# Patient Record
Sex: Male | Born: 1935 | Race: White | Hispanic: No | Marital: Married
Health system: Southern US, Community
[De-identification: ages and names within clinical notes are randomized; demographics above are authoritative.]

---

## 2000-07-23 ENCOUNTER — Inpatient Hospital Stay (HOSPITAL_COMMUNITY)
Admission: EM | Admit: 2000-07-23 | Discharge: 2000-07-31 | Payer: Self-pay | Admitting: Thoracic Surgery (Cardiothoracic Vascular Surgery)

## 2000-07-23 ENCOUNTER — Encounter (INDEPENDENT_AMBULATORY_CARE_PROVIDER_SITE_OTHER): Payer: Self-pay | Admitting: Specialist

## 2000-07-23 ENCOUNTER — Encounter (INDEPENDENT_AMBULATORY_CARE_PROVIDER_SITE_OTHER): Payer: Self-pay | Admitting: *Deleted

## 2000-07-23 ENCOUNTER — Encounter: Payer: Self-pay | Admitting: Thoracic Surgery (Cardiothoracic Vascular Surgery)

## 2000-07-24 ENCOUNTER — Encounter: Payer: Self-pay | Admitting: Thoracic Surgery (Cardiothoracic Vascular Surgery)

## 2000-07-26 ENCOUNTER — Encounter: Payer: Self-pay | Admitting: Critical Care Medicine

## 2005-01-02 ENCOUNTER — Ambulatory Visit: Payer: Self-pay | Admitting: Unknown Physician Specialty

## 2006-03-25 ENCOUNTER — Inpatient Hospital Stay (HOSPITAL_COMMUNITY): Admission: EM | Admit: 2006-03-25 | Discharge: 2006-04-02 | Payer: Self-pay | Admitting: *Deleted

## 2006-03-28 ENCOUNTER — Ambulatory Visit: Payer: Self-pay | Admitting: Physical Medicine & Rehabilitation

## 2006-04-02 ENCOUNTER — Encounter: Payer: Self-pay | Admitting: Internal Medicine

## 2006-04-18 ENCOUNTER — Other Ambulatory Visit: Payer: Self-pay

## 2006-04-18 ENCOUNTER — Emergency Department: Payer: Self-pay | Admitting: Emergency Medicine

## 2006-04-30 ENCOUNTER — Ambulatory Visit: Payer: Self-pay | Admitting: Neurosurgery

## 2006-05-01 ENCOUNTER — Ambulatory Visit: Payer: Self-pay | Admitting: Cardiology

## 2006-05-01 ENCOUNTER — Inpatient Hospital Stay (HOSPITAL_COMMUNITY): Admission: AD | Admit: 2006-05-01 | Discharge: 2006-05-07 | Payer: Self-pay | Admitting: Neurosurgery

## 2006-05-02 ENCOUNTER — Encounter: Payer: Self-pay | Admitting: Internal Medicine

## 2006-05-04 ENCOUNTER — Ambulatory Visit: Payer: Self-pay | Admitting: Thoracic Surgery (Cardiothoracic Vascular Surgery)

## 2006-05-07 ENCOUNTER — Ambulatory Visit: Payer: Self-pay | Admitting: Physical Medicine & Rehabilitation

## 2006-05-07 ENCOUNTER — Inpatient Hospital Stay (HOSPITAL_COMMUNITY)
Admission: RE | Admit: 2006-05-07 | Discharge: 2006-05-28 | Payer: Self-pay | Admitting: Physical Medicine & Rehabilitation

## 2006-08-06 ENCOUNTER — Encounter: Payer: Self-pay | Admitting: Neurosurgery

## 2006-09-02 ENCOUNTER — Encounter: Payer: Self-pay | Admitting: Neurosurgery

## 2006-10-02 ENCOUNTER — Encounter: Payer: Self-pay | Admitting: Neurosurgery

## 2007-08-16 IMAGING — CR DG THORACIC SPINE 2V
3 series · 3 of 3 positions shown · non-contrast
Comparison: none

CLINICAL DATA: Thoracic cord compression

THORACIC SPINE - 2 VIEW

[t t-spine a.p.]
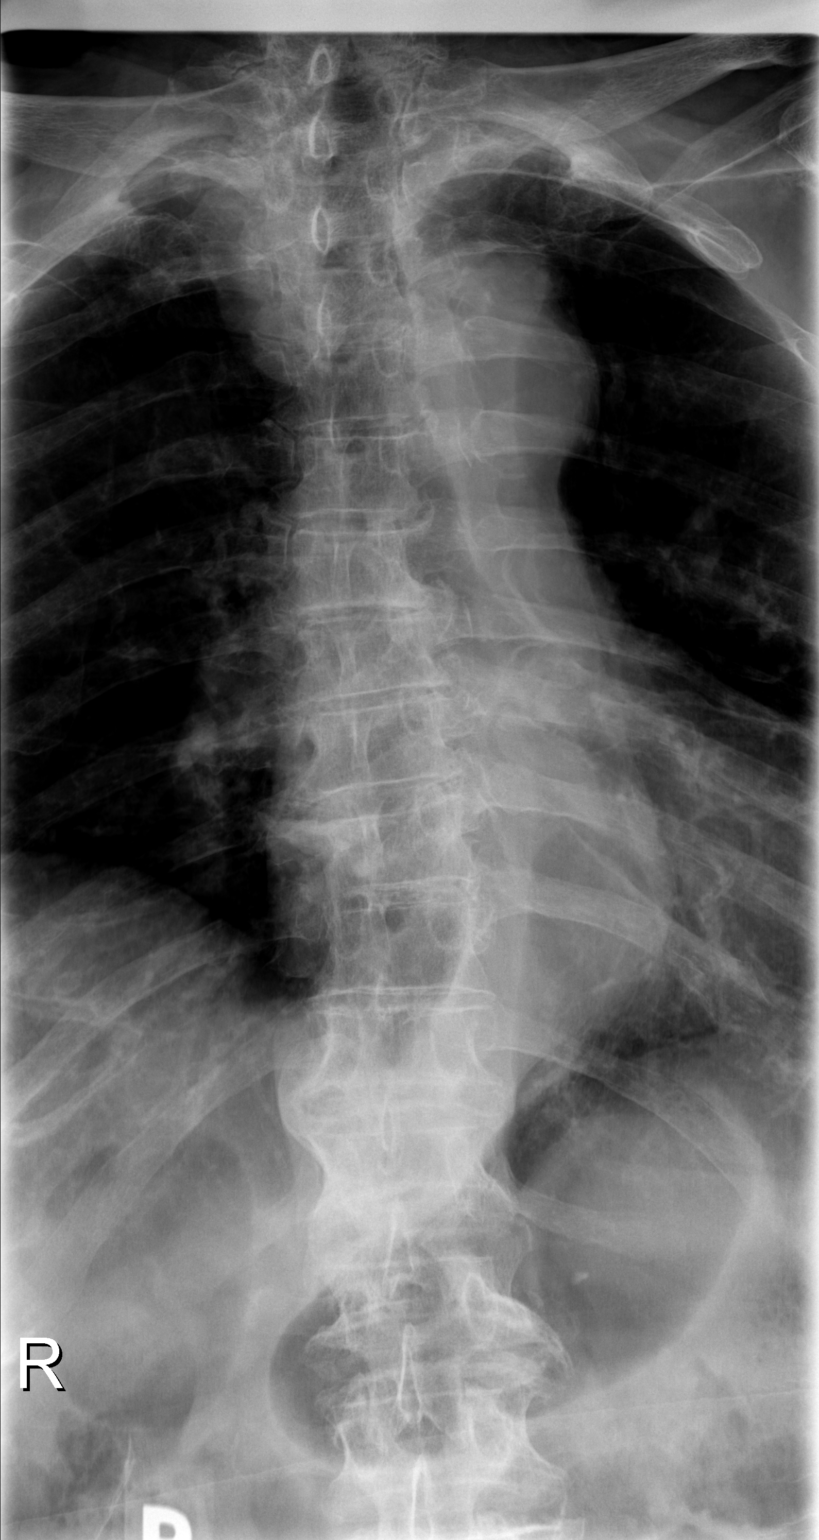

[t t-spine lat]
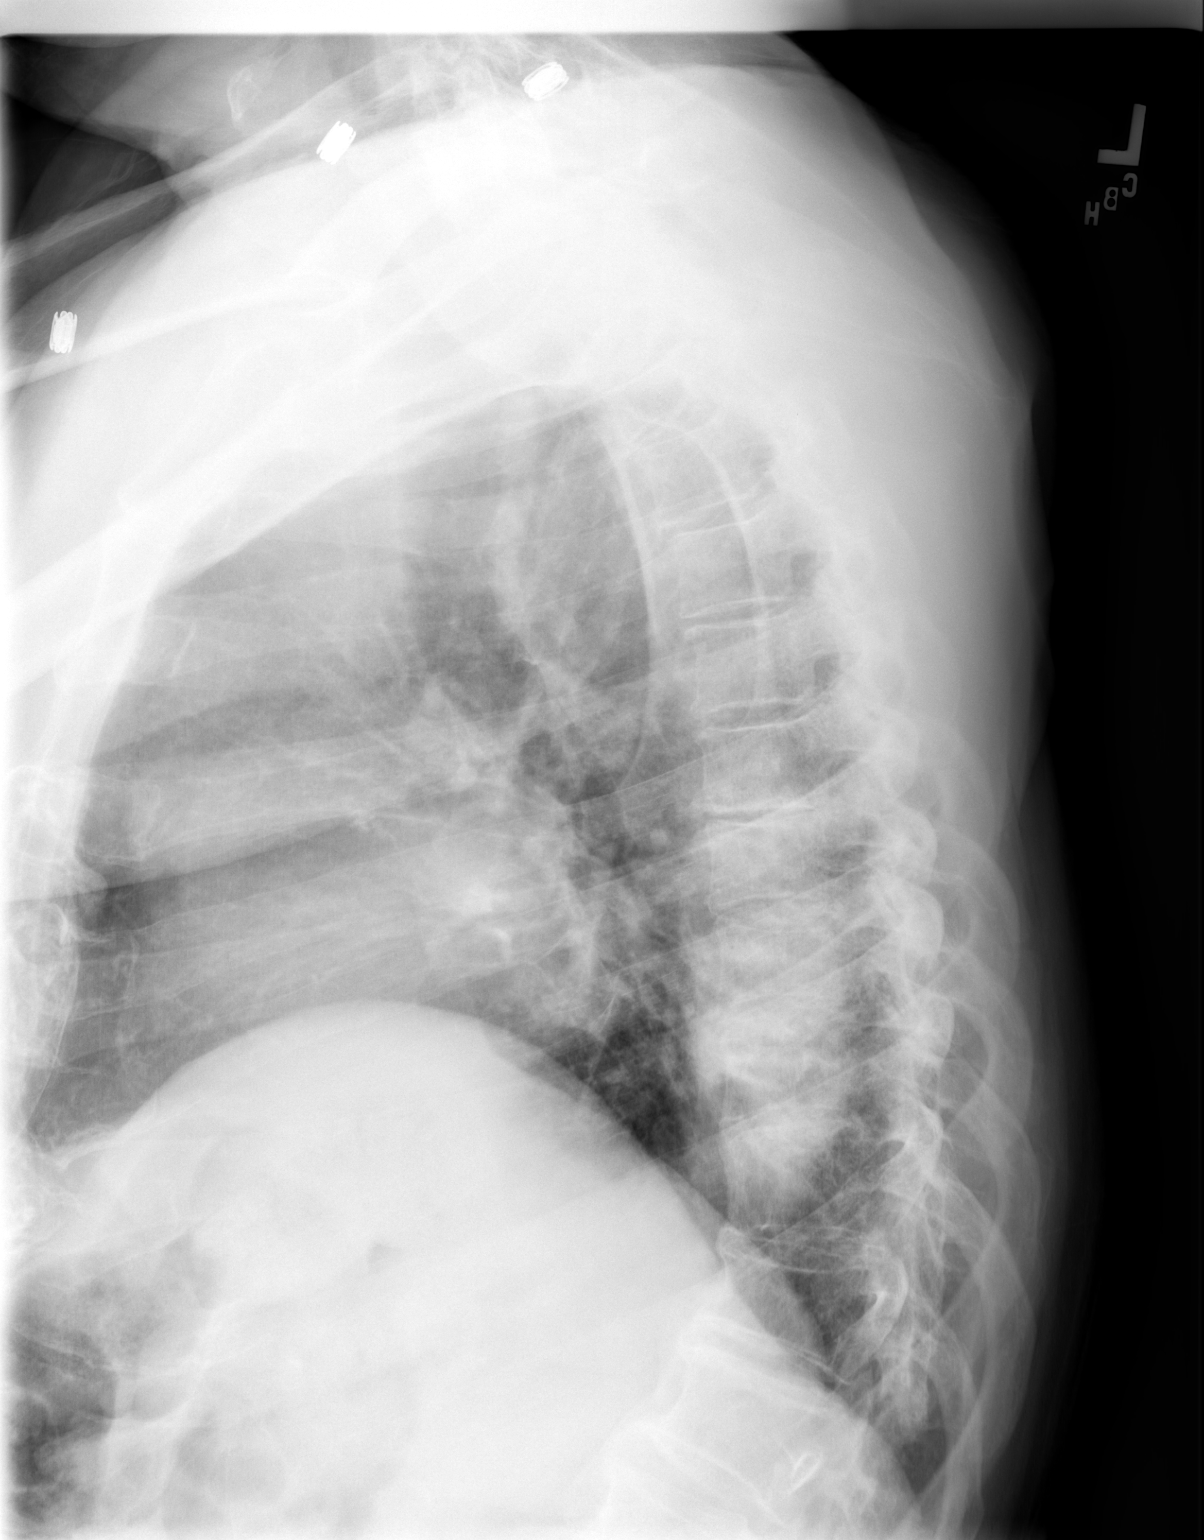

[t swimmers]
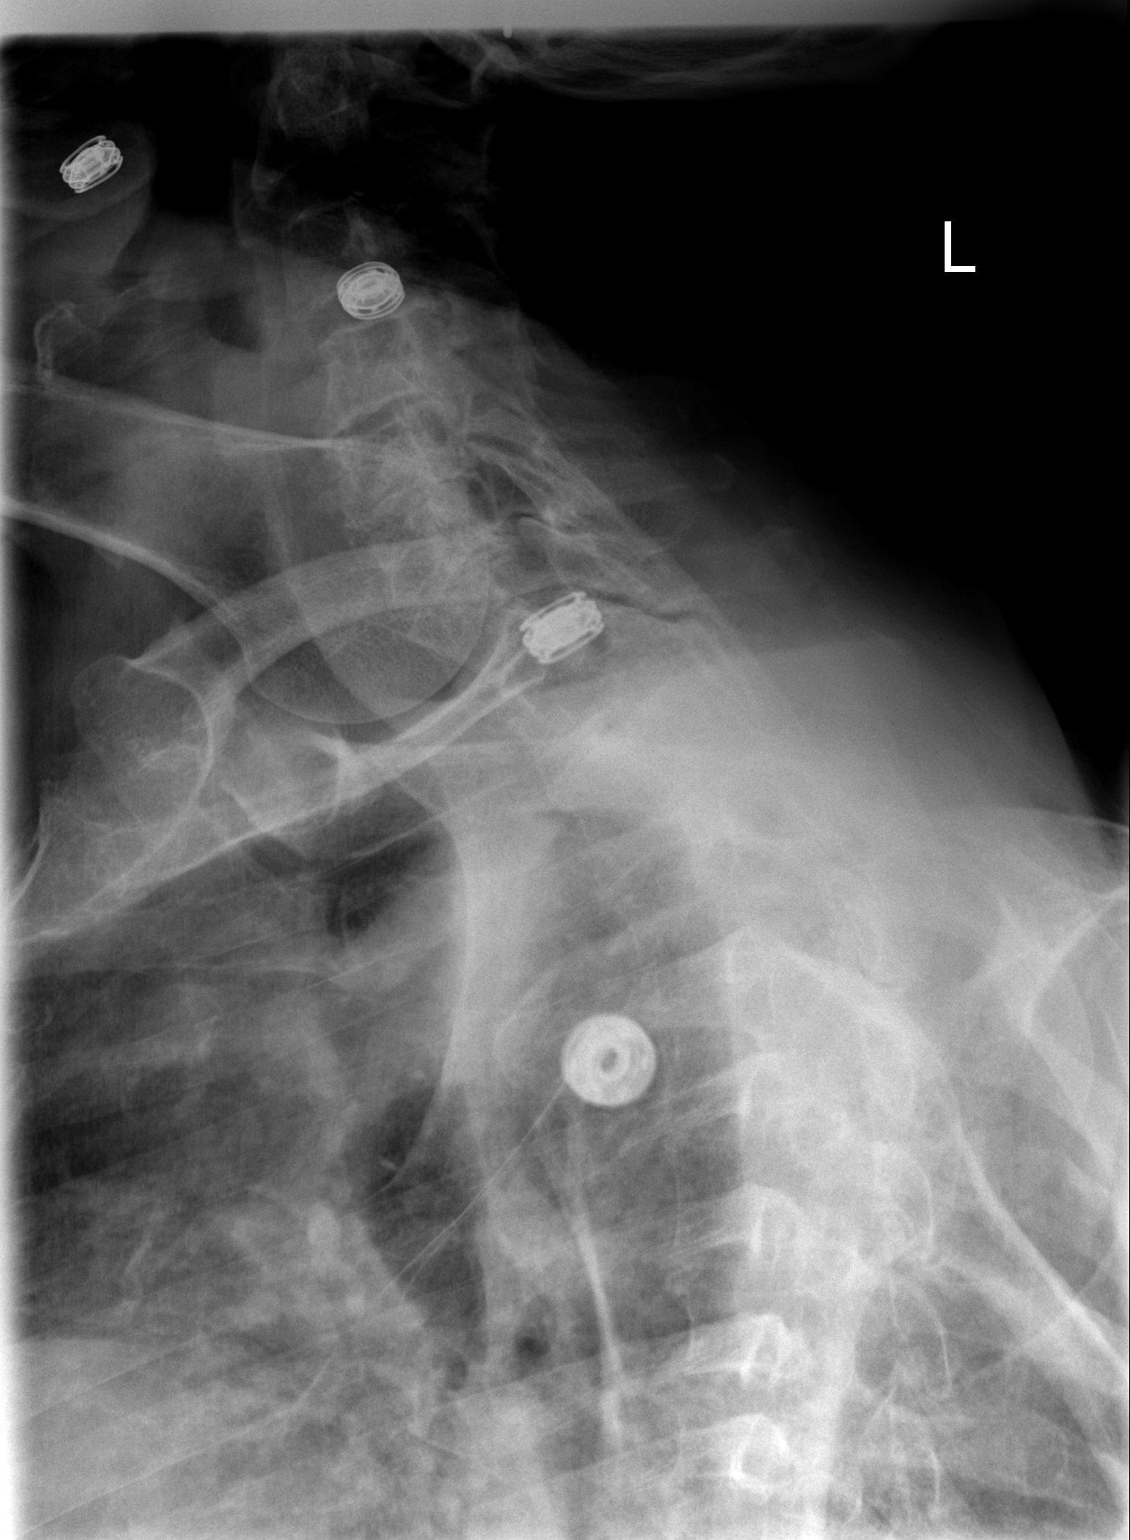

[3 of 3 positions shown; findings below may reference images not displayed]

FINDINGS: There is slight compression of a lower thoracic vertebral body with
associated kyphosis. No malalignment otherwise. Mild degenerative changes
throughout the thoracic spine.

IMPRESSION

Slight compression of the lower thoracic vertebral body with associated
kyphosis.

Mild degenerative changes.

## 2007-08-16 IMAGING — CR DG CHEST 2V
1 series · 1 of 1 positions shown · non-contrast
Comparison: 03/25/2006

CLINICAL DATA: Thoracic cord compression

CHEST - 2 VIEW:

[view not recorded]
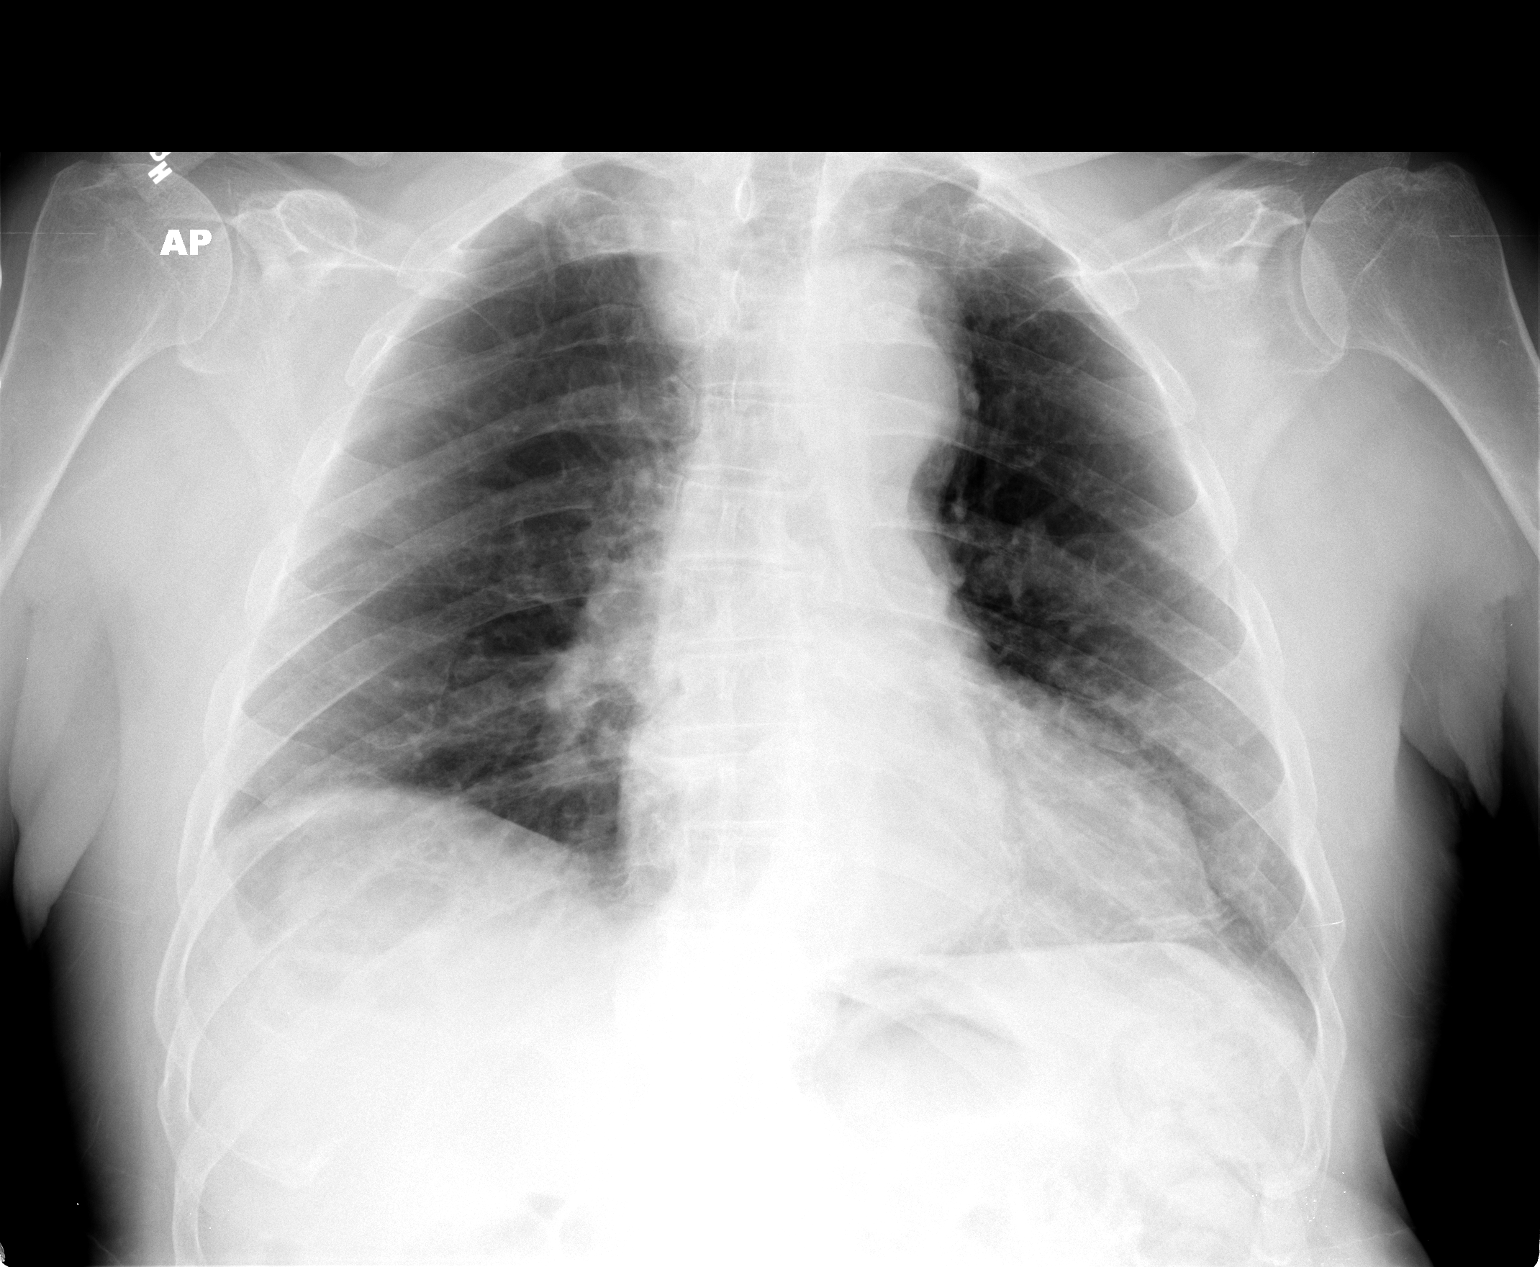

[1 of 1 positions shown; findings below may reference images not displayed]

FINDINGS: There is mild cardiomegaly. Aneurysmal dilatation of the aortic arch
again noted, stable. Tortuosity of the remainder of the thoracic aorta present.
There is bibasilar atelectasis or scarring. No effusions.

Slight compression fracture seen within the lower thoracic spine with associated
kyphosis.
IMPRESSION: Mild cardiomegaly.

Stable aneurysmal dilatation of the aortic arch.

Bibasilar atelectasis or scarring.

## 2008-05-12 ENCOUNTER — Encounter: Payer: Self-pay | Admitting: Internal Medicine

## 2008-06-01 ENCOUNTER — Encounter: Payer: Self-pay | Admitting: Internal Medicine

## 2008-11-17 ENCOUNTER — Ambulatory Visit: Payer: Self-pay | Admitting: Internal Medicine

## 2008-11-23 ENCOUNTER — Ambulatory Visit: Payer: Self-pay | Admitting: Internal Medicine

## 2008-12-01 ENCOUNTER — Ambulatory Visit: Payer: Self-pay | Admitting: Internal Medicine

## 2008-12-08 ENCOUNTER — Ambulatory Visit: Payer: Self-pay | Admitting: Internal Medicine

## 2008-12-15 ENCOUNTER — Ambulatory Visit: Payer: Self-pay | Admitting: Internal Medicine

## 2008-12-22 ENCOUNTER — Ambulatory Visit: Payer: Self-pay | Admitting: Internal Medicine

## 2008-12-30 ENCOUNTER — Ambulatory Visit: Payer: Self-pay | Admitting: Internal Medicine

## 2009-01-06 ENCOUNTER — Ambulatory Visit: Payer: Self-pay | Admitting: Internal Medicine

## 2009-01-13 ENCOUNTER — Ambulatory Visit: Payer: Self-pay | Admitting: Internal Medicine

## 2009-01-28 ENCOUNTER — Ambulatory Visit: Payer: Self-pay | Admitting: Internal Medicine

## 2010-01-22 ENCOUNTER — Encounter: Payer: Self-pay | Admitting: Thoracic Surgery (Cardiothoracic Vascular Surgery)

## 2010-05-19 NOTE — Consult Note (Signed)
NAMELEVERT, Timothy [name]               ACCOUNT NO.:  [id number]   MEDICAL RECORD NO.:  [id number]          PATIENT TYPE:  IPS   LOCATION:  4008                         FACILITY:  [facility]   PHYSICIAN:  Lucrezia Starch. Earlene Plater, M.D.  DATE OF BIRTH:  1920/03/17   DATE OF CONSULTATION:  05/21/2006  DATE OF DISCHARGE:                                 CONSULTATION   REASON FOR CONSULT:  Urinary retention.   HISTORY:  Patient is currently a resident in the rehab center at [facility].  He was admitted here on May 07, 2006 for weakness and  continued leg buckling status post recent surgery.  He underwent T7-T8  transpedicular diskectomy and microdiskectomy of T7-T8 HNP on May 2.  Since being in rehab, has required consistent and intermittent  catheterization for retention.  Patient does have occasional voids with  BM; however, unable to generate voluntary stream.  He does have a  history of PH, on Flomax at home; however, denies significant urinary  symptoms, no previous prostate surgery, and no previous episodes of  urinary retention.  He is currently on Flomax 0.8 mg daily and  Urecholine 50 mg p.o. q.i.d.   PAST MEDICAL HISTORY:  Significant for:  1. A history of pelvic fracture.  2. Left wrist fracture with ORIF.  3. Thoracic aortic aneurysm.  4. PE.  5. Anxiety.  6. Hypertension.  7. Colon polyps.  8. Pernicious anemia.  9. Thoracic spondylolisthesis.  10.BPH.  11.Left knee with osteoarthritis.  12.Type III aortic dissection, managed medically.  13.T&A.  14.Appendectomy.   FAMILY HISTORY:  Positive for CAD and cancer.   SOCIAL HISTORY:  Married.   PHYSICAL EXAM:  GENERAL:  77 year old white male in no acute distress,  alert and oriented x3.  VITAL SIGNS:  Stable.  HEENT:  Normocephalic.  ABDOMEN:  Soft and nontender.  GU:  Prostate 40 g without masses.   Urine culture negative.  BUN 16, creatinine 0.89.   IMPRESSION:  1. Urinary retention.  2. Benign prostatic  hypertrophy.   PLAN:  18-French Cude was placed without difficulty.  We will continue  Flomax.  We will discontinue Urecholine at this point.  Have patient  follow up with urology clinic once discharged, and those instructions  were placed on the chart and teach leg bag use prior to discharge.     ______________________________  Alessandra Bevels. Chase Picket, FNP-C      Ronald L. Earlene Plater, M.D.  Electronically Signed    JML/MEDQ  D:  05/21/2006  T:  05/22/2006  Job:  [id number]

## 2010-05-19 NOTE — Discharge Summary (Signed)
Timothy [name], Timothy [name]               ACCOUNT NO.:  [id number]   MEDICAL RECORD NO.:  [id number]          PATIENT TYPE:  INP   LOCATION:  3041                         FACILITY:  [facility]   PHYSICIAN:  Danae Orleans. Venetia Maxon, M.D.  DATE OF BIRTH:  1920-09-17   DATE OF ADMISSION:  03/25/2006  DATE OF DISCHARGE:                               DISCHARGE SUMMARY   HOSPITAL COURSE:  Patient is an 77 year old man who was admitted through  [facility] emergency room when he was unable to stand or walk.  He has been having progressively more incapacitating problems with  lumbar spinal stenosis over the last 8 years.  He was admitted to the  hospital on 03/25/2006.  He had full strength on confrontational testing  but inability to stand for more than a few minutes at a time.  He was  admitted to the hospital and then taken to surgery on 03/26/2006 when he  underwent uncomplicated decompression surgery at L3-L4 and L4-L5 with  bilateral laminar foraminotomies for stenosis.   Postoperatively he had problems with urinary retention requiring  placement of indwelling catheter and was gradually mobilized with  physical therapy.  He had full strength on confrontational testing in  both lower extremities and plantar and dorsal flexion and has had  progressive improvement in his lower extremity numbness.  He felt that  he is getting better.   In time, he was initially felt to be a good candidate for inpatient  rehabilitation at [facility], but he subsequently wished to be  transferred closer to [facility], and the plan is for him to do so.   He will follow up with me in my office in 3-4 weeks for postoperative  visit.   DISCHARGE MEDICATIONS:  1. Lisinopril 20 mg daily.  2. Hydrochlorothiazide 12.5 mg daily.  3. Amlodipine 10 mg daily.  4. Metoprolol XL 100 mg daily sustained release.  5. He takes diazepam 5 mg every 6 hours as needed for musculature      spasm although has not been taking,  not been requiring that; and      hydrocodone 1-2 every 4-6 hours as needed for pain.      Danae Orleans. Venetia Maxon, M.D.  Electronically Signed     JDS/MEDQ  D:  04/01/2006  T:  04/01/2006  Job:  [id number]

## 2010-05-19 NOTE — H&P (Signed)
[place]. [facility]  Patient:    Timothy [name], Timothy [name]                        MRN: [id number] Adm. Date:  07/23/00 Attending:  Salvatore Decent. [name], M.D. Dictator:   Adair Patter, P.A.                         History and Physical  CHIEF COMPLAINT AND HISTORY OF PRESENT ILLNESS:  This is a 77 year old man who complained of chest pain and shortness of breath that started approximately five days ago.  He was seen by his medical doctor who treated him for indigestion and discharged him home.  Patient states that the pain was not relieved and was described as pressure that started on his right shoulder blade.  He said it was worse with breathing and pleuritic in nature.  The pain did not subside so he went to [facility] where he was found to have a pulmonary embolism and thoracic aortic aneurysm by CAT scan. Patient also underwent a stress test on this date which was found to be normal.  Because of his pulmonary embolism and thoracic aortic aneurysm, Timothy [name] was transferred to [facility] under the care of Dr. Salvatore Decent. [name] for treatment of this thoracic aneurysm and pulmonary embolism.  PAST MEDICAL HISTORY:  Hypertension.  PAST SURGICAL HISTORY: 1. Open reduction and internal fixation of his left wrist. 2. Appendectomy.  ALLERGIES:  The patient denies any known medication allergies.  MEDICATIONS: 1. Aspirin 325 mg one tablet daily. 2. Altace 2.5 mg one tablet daily. 3. Prilosec 20 mg one tablet twice day.  FAMILY HISTORY:  Noncontributory.  SOCIAL HISTORY:  Patient states he has a 10- to 15-year history of smoking, however, he quit 40 years ago.  He denies any alcohol or illicit drug use.  He lives at home with his wife.  PHYSICAL EXAMINATION:  VITAL SIGNS:  Patient is found to be afebrile with stable vital signs and 96% pulse oximetry on room air.  GENERAL:  Patient is alert and oriented x 3 and appears to be in no state  of apparent distress.  HEENT:  Head is atraumatic, normocephalic.  Eyes:  Pupils equal, round and reactive to light and accommodation.  Extraocular muscles are intact without scleral icterus or nystagmus.  Ears:  Auditory acuity is grossly intact. Nose:  Sinuses are intact.  Nasal patency is intact.  Mouth is moist without exudates.  NECK:  Supple.  Trachea is in midline without adenopathy, lymphadenopathy or thyromegaly.  No carotid bruits are noted.  CARDIOVASCULAR:  Regular rate and rhythm without murmurs, gallops or rubs.  LUNGS:  Clear to auscultation bilaterally without rales, rhonchi or wheezing.  ABDOMEN:  Soft, nontender, nondistended.  Positive bowel sounds in all four quadrants.  EXTREMITIES:  No gross orthopedic abnormalities.  Homans sign was negative. No calf tenderness is noted bilaterally.  No clubbing, cyanosis, edema or swelling of his lower extremities were noted bilaterally.  PERIPHERAL PULSES:  Exam revealed 2+ carotid, radial and brachial pulses bilaterally.  Patient had 2+ femoral pulses bilaterally.  He had 1+ popliteal pulses bilaterally.  He had 2+ dorsalis pedis pulses bilaterally and 1+ posterior tibial pulses bilaterally.  NEUROLOGIC:  Patient is alert and oriented x 3.  Cranial nerves II-XII are grossly intact.  No focal neurologic deficits were noted.  IMPRESSION:  [age]-year-old man with thoracic aneurysm and  pulmonary embolism by CAT scan taken to [facility].  PLAN:  Unfortunately, Timothy [name] was transferred to [facility] without copies of his CAT scan.  Ultimately, he will need anticoagulation for his pulmonary embolism, however, we need to ensure that his thoracic aneurysm is stable before starting any anticoagulation; because of this, patient will be sent for a STAT CAT scan of his chest with IV contrast.  Once this CAT scan is completed, we will tailor our treatment of his conditions accordingly.  DD: 07/23/00 TD:  07/24/00 Job: [id number]

## 2010-05-19 NOTE — Consult Note (Signed)
[place]. [facility]  Patient:    Timothy [name], Timothy [name]                        MRN: [id number] Proc. Date: 07/24/00 Attending:  Charlcie Cradle. Delford Field, M.D. [facility] CC:         Salvatore Decent. [name], M.D.  Guido Sander, M.D.; [place], [place]   Consultation Report  CHIEF COMPLAINT:  Pulmonary embolism, right upper lobe lung mass.  HISTORY OF PRESENT ILLNESS:  A 77 year old white male noted a weeks worth of fatigue, increased chest discomfort, pleuritic chest pain in the right upper chest area.  No real cough.  No hemoptysis.  Does not increased dyspnea, increased anorexia, but denies any fevers, chills, or sweats.  There is no previous prior cough to this.  Weight has been adequate.  Appetite good until last week.  Does note some left lower extremity edema.  Patient was admitted to [facility] on July 21, 2000 with atypical chest pain syndrome.  Had a negative myocardial ischemia scan.  CT chest showed right upper lobe cavitation and pulmonary embolism on spiral CT protocol.  Patient was started on Lovenox and transferred to [facility] for further evaluation because he was also noted to have a type III aortic dissection.  Question of whether or not anticoagulation was indicated.  Patient has a long-standing history of hypertension on prinzide.  This is the only other past history of note.  PAST MEDICAL HISTORY:  Hypertension as noted above.  No history of MI.  No history of TB exposure.  SOCIAL HISTORY:  Worked painting automobiles.  Did have some granular dust exposure.  Smoked 10 years two packs a day.  Quit 40 years ago.  REVIEW OF SYSTEMS:  No previous of known lung disease.  No chronic weight loss, fevers, chills, or sweats.  Noncontributory.  OPERATION:  Wrist fracture repair and appendectomy.  ALLERGIES:  None.  MEDICATIONS: 1. Prinzide 50/25 one q.d. 2. Aspirin 81 mg q.d.  FAMILY HISTORY:  Mother died of breast cancer.  PHYSICAL  EXAMINATION  VITAL SIGNS:  Temperature 98, blood pressure 130/60, pulse 65, saturation 95% on room air.  CHEST:  Equal breath sounds without evidence of wheeze or rhonchi.  CARDIAC:  Regular rate and rhythm with S3.  Normal S1, S2.  ABDOMEN:  Soft, nontender.  Bowel sounds active.  EXTREMITIES:  No edema, clubbing, or venous disease.  NEUROLOGIC:  Grossly intact.  HEENT:  No jugular venous distention, lymphadenopathy.  Oropharynx:  Clear.  NECK:  Supple.  LABORATORIES:  CT scan of the chest is reviewed and spiral PE protocol.  There are multiple pulmonary emboli in the lower lobes.  There is right hilar and mediastinal adenopathy and right upper lobe cavitary lesion without air fluid level.  There is a type III dissecting aortic aneurysm noted.  EKG showed sinus rhythm with PACs.  No ST-T wave changes.  From transfer:  White count 9.9, hemoglobin 14.8, platelet count 278,000. Sodium 132, potassium 3.8, chloride 95, CO2 24, BUN 26, creatinine 1.3, blood sugar 102.  Troponin I 0.1, CK-MB unremarkable.  PT/PTT were normal at baseline.  IMPRESSION: 1. Left upper lobe cavitary lesion with shaggy thickened border, pleural    involvement, and mediastinal and right hilar adenopathy compatible with    malignancy until proven otherwise of the lung. 2. Bilateral pulmonary embolism, rule out deep venous thrombosis.  Patient    likely hypercoagulable secondary to pulmonary process. 3. Thoracic aortic aneurysm type  III dissection, stable, and is non-operative    in approach.  RECOMMENDATIONS: 1. Non-operative treatment of aneurysm per CVTS.  Control of blood pressure. 2. Pursue bronchoscopy on July 26, 2000 to allow at least 72 hours for    heparinization to occur to stabilize clot.  Needs to be off heparin for at    least six to eight hours for the bronchoscopy.  At this point I do not    believe inferior vena cava filter is indicated as the patient is stable    with the current  pulmonary embolism. 3. IV heparin for pulmonary embolism and check venous Doppler studies of the    lower extremities.  Hold Coumadin for now until after bronchoscopy.  Will    transfer to our primary pulmonary service, involve CVTS as needed. DD:  07/24/00 TD:  07/24/00 Job: [id number]

## 2010-05-19 NOTE — Op Note (Signed)
Timothy [name], Timothy [name]               ACCOUNT NO.:  [id number]   MEDICAL RECORD NO.:  [id number]          PATIENT TYPE:  INP   LOCATION:  3110                         FACILITY:  [facility]   PHYSICIAN:  Danae Orleans. Venetia Maxon, M.D.  DATE OF BIRTH:  04/11/1920   DATE OF PROCEDURE:  05/03/2006  DATE OF DISCHARGE:                               OPERATIVE REPORT   PREOPERATIVE DIAGNOSIS:  Herniated thoracic disks, T7-8, with cord  compression and paraparesis.   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   PROCEDURE:  Left T7-8 transpedicular diskectomy with microdissection.   SURGEON:  Danae Orleans. Venetia Maxon, M.D.   ASSISTANT:  Benard Rink, R.N.   ANESTHESIA:  General endotracheal anesthesia.   ESTIMATED BLOOD LOSS:  Minimal.   COMPLICATIONS:  None.   DISPOSITION:  To recovery.   INDICATIONS:  Timothy [name] is an 77 year old man with bilateral with  paraparesis who was found to have a large thoracic disk herniation at T7-  8 causing significant cord compression.  It was elected to take him to  surgery for transpedicular thoracic diskectomy at this level.   PROCEDURE:  Timothy [name] was brought to the operating room.  Following  satisfactory uncomplicated induction of general endotracheal anesthesia  and placement of intravenous lines, the patient was placed in a prone  position on flap rolls.  His back was then prepped and draped in the  usual sterile fashion.  Plain incision was infiltrated with 0.25%  Marcaine and 0.50% lidocaine and 1:200,000 epinephrine.  Prior to  prepping his back, localizing x-ray was obtained which demonstrated  marker probe at the eighth rib.  Subsequently, incision was made and  carried down to what was felt to be corresponding rib and rib head, and  the new x-ray demonstrated this in fact to the seventh rib, and so  consequently, exposure was carried one level lower.  After confirming  rectal level, the high-speed year  was used to drill out the left T8  pedicle.  Initial exposure came down over the nerve root, and  subsequently drilling was carried cephalad into the facet joint complex,  and the pedicle was drilled out.  The lateral aspect of the spinal canal  was identified, remaining thin width layer of overlying bone was removed  including the superior articular process of the T8 and the inferior  facette portion of the inferior of facet of T7.  The disk space was  encountered and entered with micro pituitary, and multiple fragments of  disk material were removed.  Then working medially underneath the spinal  canal which this was all done under a microscopic visualization, the  large amount of herniated disk material was removed which was directly  compressing the thoracic spinal cord, and the spinal canal was  decompressed with removal of multiple large fragments of disk material.  It was felt that the dura became visible and was no longer deflected to  the right, and it was felt at this point that the spinal canal was well  decompressed.  Hemostasis was then referred, and Depo Medrol was placed  in the operative field.  The self-retaining retractor was removed.  The  thoracolumbar fascia was closed with 0 Vicryl sutures, subcutaneous  tissues were approximated with 2-0 Vicryl interrupted inverted sutures,  and skin edges were approximated with interrupted 3-0 Vicryl  subcuticular stitch.  The wound was dressed with Dermabond.  At the very  end of the case, the patient had elevation of ST segments on EKG and was  extubated in the operating room and taken to the recovery room.  A  cardiology consult was initiated because of this.  He was also to be  observed in the neuro ICU postoperatively.      Danae Orleans. Venetia Maxon, M.D.  Electronically Signed     JDS/MEDQ  D:  05/03/2006  T:  05/03/2006  Job:  [id number]

## 2010-05-19 NOTE — Discharge Summary (Signed)
NAMEENDRIT, GITTINS NO.:  [id number]   MEDICAL RECORD NO.:  [id number]          PATIENT TYPE:  IPS   LOCATION:  4008                         FACILITY:  [facility]   PHYSICIAN:  Ellwood Dense, M.D.   DATE OF BIRTH:  Oct 25, 1920   DATE OF ADMISSION:  05/07/2006  DATE OF DISCHARGE:  05/28/2006                               DISCHARGE SUMMARY   DISCHARGE DIAGNOSES:  1. T7-T8 diskectomy with microdissection.  2. Benign prostatic hypertrophy with urinary retention.  3. Hypertension.  4. Paraparesis.  5. Insomnia resolved.  6. Delirium resolved.   HISTORY OF PRESENT ILLNESS:  Mr. Kidney is an 77 year old male with  history of hypertension and pernicious anemia, lumbar laminectomies at  L3 to L5 on March 25. Discharged to [facility] on March 31.  The  patient was noted to have difficulty standing with inability to walk and  paraparesis.  Admitted April 30 to [facility] for workup.  MRI  of T spine showed mild compression fracture of T11 end plate with slight  cord compression with as well as aortic arch and descending thoracic  aneurysm.  The patient underwent left T7 to T8  transpedicular  diskectomy with microdissection of T7-T8, HNP on May 2 by Dr. Venetia Maxon.  Postop with EKG changes secondary to anterior T-wave inversion.  Dr.  Antoine Poche consulted for input and cardiac enzymes check.  Beta-blocker  was recommended.  The patient to have Myoview study on outpatient basis.  CVTS was consulted for type 3 dissection and they felt this was stable  without any changes.  Followup with Dr. [name] in six months with CT angio  of thoracic arch.   PAST MEDICAL HISTORY:  1. History of PE in 2002.  2. Thoracic arch aneurysm.  3. Left wrist fracture.  4. ORIF pelvic fracture.  5. T&A.  6. Appendectomy.  7. Anxiety and occasional tremors.  8. Type 3 aortic dissection.  9. Left knee with moderate to severe osteoarthritis.  10.BPH.  11.Thoracic spondylosis.  12.Pernicious anemia.  13.Colon polyps.  14.Hypertension.   ALLERGIES:  VALIUM and PERCOCET.   FAMILY HISTORY:  Positive for coronary artery disease and cancer.   SOCIAL HISTORY:  The patient is married, was independent in walking  prior to March 2008.  Does not use any tobacco or alcohol.  Lives in two-  level home with three steps at entry.   HOSPITAL COURSE:  Mr. Natnael Biederman was admitted to rehab on May 07, 2006  for inpatient therapies to consist of PT/OT daily.  Past admission the  patient was maintained on Flomax and as history of urinary retention  after last surgery, PVRs were checked with __________  scan.  Blood  pressure is monitored initially on HCTZ, Prinivil and Lopressor.  Blood  pressures have been checked on b.i.d. basis and noted to be running lows  in 90s to 50s initially.  HCTZ as well as Prinivil were discontinued.  Currently the patient remains on Lopressor 50 mg daily with blood  pressures ranging from 110s to 130s systolic, 50s to 16X diastolic.  Heart rate stable in 50s  to 60s range.  Labs were checked past admission  and revealed hemoglobin 12, hematocrit 34.1, white count of 11,  platelets 98,000.  Check of lytes showed mild hyponatremia with sodium  at 133.  Recheck lytes of May 17, 05/17 shows sodium 133, potassium 4.1,  chloride 99, CO2 27, BUN 16, creatinine 0.89, glucose 99.  Urine culture  was checked on May 6 and May 18, both showing no signs of infection.  PVRs checked past admission initially showed volumes of 375-800 mL.  The  patient was started on Urecholine and Flomax was increased to 0.8 mg at  bedtime.  The patient has been unable to void, requiring continued  catheterizations 4 to 5 times a day during this stay.  Urology was  consulted for input and recommended discontinuing Urecholine and placing  him on Foley for straight drainage with followup with the urology past  discharge for further studies.  The patient did report discomfort with   Foley and preferred instead to do in-and-out cath.  He has been taught  to do in-and-out cath five times a day scheduled to keep bladder wall  use low and prevent bladder distension.  Currently the patient is  performing self care without difficulty.  He has been instructed  regarding keeping fluid input low past supper.   The patient's back incision has healed well without any signs or  symptoms of infection.  Therapies have been ongoing with the patient  continuing with ataxia and weakness of the lower extremities.  Strength  bilateral upper extremities within normal limits.  Right lower extremity  reveals hip at 3/5.  The knee extensors 4+/5, knee flexion is 3+/5, and  ankles at 3+/5.  The patient is currently modified independent for  wheelchair propulsion. He is supervision for sliding board transfers,  able to maintain standing balance with min assist, able to walk 25 feet  with mod controlled gait with LRAD.  He is requires min assist with  sliding board for car transfers.  For ADLs, the patient is overall min  assist for ADLs, except mod assist for toileting.  He is modified  independent for simple low management tasks at wheelchair level.  Options regarding SNF were offered to wife.  She has completed family  education and feels she will be able to manage him at home with  continued progressive home health PT/OT for further therapies.  Home  health RN has been arranged for monitoring in-and-out cath.  On May 28, 2006 the patient is discharged to home.   MEDICATIONS:  1. Lopressor 50 mg per day.  2. Multivitamin one per day.  3. Flomax 0.4 mg two p.o. at bedtime.  4. Senokot-S one to two at bedtime. p.r.n.  5. Tylenol as needed for pain.   DIET:  Regular.   ACTIVITIES:  Twenty-four-hour supervision assistance at wheelchair  level.  Transfers with supervision.  No alcohol, no smoking, no driving.  Continued catheterization at 7a.m., noon, 6 p.m., 10 p.m., and 3  a.m.  FOLLOW UP:  The patient to follow up with Dr. Venetia Maxon for postop check in  June.  Follow up with Dr. Darvin Neighbours or Dr. Evelene Croon for GU for further work-  up.  Follow up with Dr. Dareen Piano June 6 at 10:15 a.m.Marland Kitchen      Greg Cutter, P.A.    ______________________________  Ellwood Dense, M.D.    PP/MEDQ  D:  05/28/2006  T:  05/29/2006  Job:  [id number]   cc:   Dr.  __________ Wilfred Lacy L. Earlene Plater, M.D.  Anola Gurney

## 2010-05-19 NOTE — Consult Note (Signed)
NAMENOAL, ABSHIER               ACCOUNT NO.:  [id number]   MEDICAL RECORD NO.:  [id number]          PATIENT TYPE:  INP   LOCATION:  3110                         FACILITY:  [facility]   PHYSICIAN:  Rollene Rotunda, MD, FACCDATE OF BIRTH:  07-04-1920   DATE OF CONSULTATION:  05/03/2006  DATE OF DISCHARGE:                                 CONSULTATION   PRIMARY CARE PHYSICIAN:  Dr. Dareen Piano at the [facility].   REQUESTING PHYSICIAN:  Danae Orleans. Venetia Maxon, M.D.   REASON FOR:  Evaluate a patient with EKG changes.   HISTORY OF PRESENT ILLNESS:  The patient is a pleasant 77 year old  gentleman with no prior coronary disease history.  He does have a  history of a type 3  aortic dissection that has been managed medically  by Dr. [name].  The patient has had progressive problems with spinal  stenosis, progressing to the point where of lower extremity paralysis.  He had decompression of L3-L4, L4-L5 stenosis on March 25, but had  progressive symptoms with thoracic cord compression.  Today, he  underwent the diskectomy and microdissection for HNP at T7-T8 with cord  compression.  Postoperatively, he was reported to have an ST-segment  elevation.  At that time there was no acute hemodynamic compromise.  The  patient was still sedated from anesthesia.  An EKG done postoperatively  did demonstrate some anterior T-wave inversions.  I do not see a pre-  procedure EKG.   In recovery, the patient has done well.  There has been no hemodynamic  instability.  Initial cardiac markers were negative.  He is now waking  up from anesthesia though still groggy.  He denies any symptoms.  He has  not been having any chest pressure, neck discomfort, or arm discomfort.  He is not having any shortness of breath.   I spoke to his wife of 59 years at length.  He has been limited walking  on a walker for several months with progressive weakness.  However,  earlier this year he was able to work in his garden.  He has  never  complained of any chest pressure, neck or arm discomfort.  He has not  had any palpitations, presyncope, or syncope.  He has not had any  shortness of breath and denies any PND or orthopnea.   PAST MEDICAL HISTORY:  1. Hypertension times years.  2. History of spinal stenosis as described.  3. Arthritis.  4. Pulmonary embolism in 2002.  5. Colonic polyps.  6. Benign prostatic hypertrophy.  7. Pernicious anemia.  8. Type 3 aortic dissection, managed medically.   PAST SURGICAL HISTORY:  1. Lumbar laminectomy.  2. Appendectomy.   ALLERGIES:  1. VALIUM.  2. PERCOCET.   MEDICATIONS:  1. Amlodipine 10 mg daily.  2. Hydrochlorothiazide 12.5 mg daily.  3. Lisinopril 20 mg daily.  4. Lopressor 50 mg b.i.d.  5. Pepcid 20 mg b.i.d.  6. Multivitamin.  7. Decadron.  8. Potassium.  9. Cefazolin.  10.Flomax.   SOCIAL HISTORY:  [place]  wife.  He is a retired  auto Education administrator.  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  He quit smoking 45 years ago.   FAMILY HISTORY:  Is contributory for his father dying suddenly of  myocardial function at age 62.   REVIEW OF SYSTEMS:  As stated in the HPI, positive for cough,  arthralgias, knee pain.  Negative for all other systems.   PHYSICAL EXAMINATION:  GENERAL:  The patient is pleasant and in no  distress, though he is somewhat groggy and confused.  VITAL SIGNS:  The blood pressure 121/61, heart rate 52 and regular,  afebrile.  HEENT:  Eyelids unremarkable.  Pupils equal, round, and reactive to  light.  Fundi not visualized.  Oral mucosa unremarkable.  Edentulous.  NECK:  The patient is lying flat and I did not assess jugular venous  distension.  He has a normal wave form.  Carotids are brisk and  symmetric, he has no bruits, no thyromegaly.  LYMPHATICS:  No cervical, axillary, inguinal adenopathy.  LUNGS:  Clear to auscultation anteriorly.  CHEST:  Unremarkable.   HEART:  PMI not displaced or sustained, S1-S2 within normal.  No S3, no  S4, no clicks, no rubs, no murmurs.  ABDOMEN:  Flat, decreased bowel sounds but no rebound or guarding, no  hepatomegaly, splenomegaly, no bruits, no midline pulsatile mass.  SKIN:  No rashes, no nodules.  EXTREMITIES:  With two plus pulses throughout, no cyanosis, clubbing, no  edema.  NEURO:  The patient is slightly confused.  He moves all his extremities.  Cranial nerves grossly intact.   EKG:  Sinus bradycardia, rate 51, leftward axis deviation, poor anterior  R-wave progression, low voltage in the chest leads, T-wave inversions V1-  V3, (I did actually find a March 25, 2006, EKG with some T-wave  inversions from the V1-V4).   LABORATORY:  Initial cardiac enzymes are negative.   ASSESSMENT/PLAN:  Transient ST-segment changes.   The patient had transient ST-segment changes that would we did not  capture.  He has not had any symptoms prior to this.  He is having no  symptoms since then.  He is hemodynamically stable.  He does have some T-  wave inversions but it turns out these have been present previously.  Given this, I do not think he has any ongoing ischemia.  We will  continue to cycle enzymes.  He can continue his medications and in  particular his beta blocker, as his blood pressure tolerates during his  recovery.  We will hold aspirin until okay with surgery.  I would check  an echocardiogram.  He should continue on telemetry.   FOLLOWUP:  We will see the patient during this hospitalization.  Thank  you for the consult.      Rollene Rotunda, MD, [facility]  Electronically Signed     JH/MEDQ  D:  05/03/2006  T:  05/03/2006  Job:  [id number]

## 2010-05-19 NOTE — Consult Note (Signed)
[name], [name]               ACCOUNT NO.:  [id number]   MEDICAL RECORD NO.:  [id number]          PATIENT TYPE:  INP   LOCATION:  5150                         FACILITY:  [facility]   PHYSICIAN:  Danae Orleans. Venetia Maxon, M.D.  DATE OF BIRTH:  1920/12/28   DATE OF CONSULTATION:  05/01/2006  DATE OF DISCHARGE:                                 CONSULTATION   REASON FOR ADMISSION:  Timothy [name] inability to stand or walk with  bilateral lower extremity weakness.   HISTORY OF PRESENT ILLNESS:  Timothy [name] is an 77 year old man who  recently was admitted for inability to stand or walk.  He was found to  have severe lumbar spinal stenosis and underwent bilateral L3-4 and L4-5  laminotomies for stenosis on March 26, 2006.  Postoperatively, he was  slowly mobilizing, was discharged to [facility] in [place].  In  the 6 to 8 weeks since his rehab admission he has not progressed and  came back to my office and was actually considerably worse with  inability to stand or walk and with bilateral hip flexor weakness.  Previously he had difficulty with standing and symptoms consistent with  neurogenic claudication, but had no weakness and had no sensory deficit.  Did not appear to have significant reflex changes.  I felt the patient  had changed considerably and now had significant weakness and I was  quite concerned about this.  I obtained a thoracic spine and lumbar  spine MRIs.  These revealed thoracic cord compression at the T7-8 level.  The patient was able to move his legs to command.  He does have some hip  flexor weakness worse on the left and is unable to stand without maximal  assistance and is totally unable to walk.  He had a Foley catheter  placed for urinary retention.  He denies any numbness and has no  evidence of numbness on examination.  He does not have any problems with  bowel control.   PAST MEDICAL HISTORY:  Significant for:  1. Hypertension.  2. Pernicious anemia.  3.  Arthritis.  4. History of abnormal chest x-ray.  5. History of chronic polyps.  6. Mild varicosities with edema.   PAST SURGICAL HISTORY:  1. Significant for prior lumbar laminectomy.  2. He has had a history of wrist fracture.  3. Pelvis fracture.  4. Tonsillectomy.  5. Appendectomy.   MEDICATIONS:  1. Norvasc 10 mg daily.  2. Hydrochlorothiazide 12.5 mg daily.  3. Lisinopril 20 mg daily.  4. Metoprolol 50 mg twice daily.  5. Flomax 0.4 mg daily.   ALLERGIES:  HE HAS NO KNOWN DRUG ALLERGIES.   PHYSICAL EXAMINATION:  GENERAL:  He is awake, alert, and fully oriented.  VITAL SIGNS:  His blood pressure is 108/82, pulse is 74, respiratory  rate is 12.  EXTREMITIES:  He has full strength in his upper extremities, bilaterally  symmetric, and he is able to move both lower extremities to command.  He  has hip flexor weakness predominantly, left greater than right, and has  some mild weakness in both feet in plantar and dorsiflexion.  He denies  any numbness in his lower extremities.  BACK:  He denies pain to palpation over the spine.  CHEST:  Clear to auscultation.  HEART:  Regular rate and rhythm without murmur.  ABDOMEN:  Soft, nontender, active bowel sounds.  No hepatosplenomegaly  appreciated.   IMAGING:  His MRI was reviewed and this is suggestive of disk herniation  versus cord compression secondary to extrinsic process such as multiple  myeloma.   PLAN:  To further image his thoracic spine with CT scan.  He was started  on Decadron.  He will likely require surgical decompression, tentatively  scheduled for May 03, 2006.  I am going to assess him for SPEP and UPEP  to rule out the possibility of myeloma.      Danae Orleans. Venetia Maxon, M.D.  Electronically Signed     JDS/MEDQ  D:  05/01/2006  T:  05/02/2006  Job:  [id number]

## 2010-05-19 NOTE — Procedures (Signed)
[facility]  Patient:    SINGLETON, HICKOX                      MRN: [id number] Proc. Date: 07/26/00 Adm. Date:  [id number] Attending:  Caleb Popp CC:         Salvatore Decent. [name], M.D.   Procedure Report  PROCEDURE:  Bronchoscopy.  INDICATION:  Right upper lobe lung mass with cavity, evaluate for cause.  ANESTHESIA:  1% Xylocaine local.  BRONCHOSCOPIST:  Charlcie Cradle. Delford Field, M.D. [facility]  PREOPERATIVE MEDICATION:  Demerol 30 mg IV push, Versed 3 mg IV push.  DESCRIPTION OF PROCEDURE:  The Olympus video bronchoscope was introduced through the right naris.  The upper airways were visualized and were unremarkable.  The entire tracheobronchial tree was visualized and revealed no endobronchial lesions.  Attention was then paid to the right upper lobe apical segment.  Biopsies five times were obtained from the right upper quadrant cavitary lesion.  Bronchial alveolar lavage was also obtained prior to this, approximately 150 cc volume infused and approximately 75 cc volume returned.  IMPRESSION:  Right upper lobe cavitary lesion with an associated pulmonary embolism state with negative PPD, evaluate for malignancy.  RECOMMENDATIONS:  Followup pathology and microbiology. DD:  07/26/00 TD:  07/28/00 Job: [id number]

## 2010-05-19 NOTE — H&P (Signed)
NAMESHARVIL, HOEY NO.:  [id number]   MEDICAL RECORD NO.:  [id number]          PATIENT TYPE:  IPS   LOCATION:  4008                         FACILITY:  [facility]   PHYSICIAN:  Ranelle Oyster, M.D.DATE OF BIRTH:  [birth year]   DATE OF ADMISSION:  05/07/2006  DATE OF DISCHARGE:                              HISTORY & PHYSICAL   CHIEF COMPLAINT:  Weakness.   HISTORY OF PRESENT ILLNESS:  This is a pleasant 77 year old white male  with history of hypertension and pernicious anemia with prior lumbar  laminectomy on March 26, 2006, who was discharged to [facility] on  April 01, 2006.  The patient continued to have difficulty standing and  problems with legs buckling.  He was admitted back to the hospital on  May 01, 2006, for further workup.  MRI of the thoracic spine revealed  mild compression fracture at T11 endplate with slight cord compression.  Aneurysms also were found in the aortic arch, as well as the descending  thoracic aorta.  The patient underwent a left T7 through T8  transpedicular discectomy and microdiscectomy of T7-T8 HNP on May 03, 2006, by Dr. Venetia Maxon.  The patient had postoperative EKG changes with T  wave inversion.  Cardiology recommended beta-blocker.  The patient has  had improvement of his strength, but legs continued to buckle,  particularly with transfers and standing.  He has had some postoperative  confusion, which generally is improving.  CVTS was consulted regarding  his type 3 dissection and this was deemed stable and six month followup  was recommended with Dr. [name].   REVIEW OF SYSTEMS:  Positive for anxiety, insomnia, retention of urine,  wound issues.  He is also hard of hearing.  Full review is in the  written H&P.   PAST MEDICAL HISTORY:  Positive for:  1. PE.  2. Thoracic aortic aneurysm.  3. Left wrist fracture and ORIF.  4. History of pelvic fracture.  5. T&A.  6. Appendectomy.  7. Anxiety with occasional tremor.  8.  Hypertension.  9. Colon polyps.  10.Pernicious anemia.  11.Thoracic spondylosis.  12.BPH.  13.Left knee with moderate to severe OA.  14.Type 3 aortic dissection managed medically.   FAMILY HISTORY:  Positive for CAD and cancer.   SOCIAL HISTORY:  The patient is married.  Independent in walking prior  to March 2008.  He has a 2-level house with 3 steps to enter.  He is a  World War II veteran.   FUNCTIONAL HISTORY:  The patient was independent prior to March 02, 2006,  using a walker for mobility.  He is currently requiring mod assist for  basic mobility and transfers.   ALLERGIES:  VALIUM AND PERCOCET, WHICH CAUSE AGITATION.   HOME MEDICATIONS:  Include:  1. Hydrochlorothiazide 12.5 mg daily.  2. Lisinopril 20 mg daily.  3. Norvasc 10 mg daily.  4. Metoprolol 50 mg b.i.d.  5. Aspirin 81 mg daily.   LABS:  Hemoglobin 13.0, white count 11.8, platelets 216,000.  Sodium  136, potassium 3.8, BUN 19, creatinine 0.95.   PHYSICAL EXAMINATION:  VITAL SIGNS:  Blood pressure is stable.  He is  afebrile.  Pulses are approximately 80 to 84.  Respiratory rate is 16.  GENERAL:  The patient is alert and oriented x3.  Behavior is  appropriate.  EAR, NOSE, AND THROAT:  Unremarkable with pink oral mucosa.  He is  edentulous.  NECK:  Supple without JVD or lymphadenopathy.  CHEST:  Notable for occasional rhonchi, but otherwise clear.  HEART:  Regular rate and rhythm without murmur, rubs, or gallops.  EXTREMITIES:  Showed trace lower extremity edema.  ABDOMEN:  Soft, nontender, bowel sounds were positive.  I did not  appreciate the aneurysm.  SKIN:  Generally intact with wound at the upper back with minimal  serosanguineous drainage.  NEUROLOGICAL:  Cranial nerves II-XII notable for decreased hearing.  Reflexes were 1+ in the lower extremities, 2+ in the upper extremities  today.  Sensation was slightly decreased in the left knee and thigh,  although minimally so.  Right leg was difficult to  differentiate from  the upper, as far as sensation was concerned.  The patient has a mild  intentional tremor, both upper extremities today.  Motor function was 4  to 4+/5 in the upper extremities, 1+/5 at the hips, 1+ to 2/5 at the  knees, and 2 to 2+ at the ankles.  Judgment, orientation, memory, and  mood were all fair.  His comprehension was sometimes clouded by his poor  hearing.   ASSESSMENT AND PLAN:  1. Functional deficits secondary to lumbar stenosis with recent      thoracic herniated nucleus pulposus and subsequent cord      compression.  The patient with mild myelopathic signs.  He is      continent of bowel and bladder.  Confusion is improving.  Begin      comprehensive inpatient rehab with PT to assess for range of      motion, strengthening, bed mobility, transfers, and gait.  OT will      assess for range of motion, strengthening, ADLs, cognitive      perceptual training, and equipment.  Rehab nurse will follow for      bowel, bladder, skin care issues and medications.  Rehab case      manager/social worker will assess for psychosocial needs and      discharge planning.  Estimated length of stay is two weeks.      Prognosis fair to good.  Goals supervision and modified      independent.  2. Benign prostatic hypertrophy.  Continue Flomax.  Check UA C&S and      urine output, as well as PVRs.  3. Hypertension.  Continue Norvasc, hydrochlorothiazide, Prinivil, and      Lopressor.  4. Insomnia.  Add low dose trazodone and observe.  5. Anxiety.  The patient without obvious signs today on exam.  Will      follow clinically.  6. Confusion.  This appears to be completely resolved.  The patient is      hard of hearing still.  7. Deep venous thrombosis prophylaxis.  Subcutaneous Lovenox.      Continue TEDs as well.      Ranelle Oyster, M.D.  Electronically Signed    ZTS/MEDQ  D:  05/07/2006  T:  05/07/2006  Job:  [id number]

## 2010-05-19 NOTE — Discharge Summary (Signed)
[facility]  Patient:    Timothy [name], Timothy [name]                      MRN: [id number] Adm. Date:  [id number] Disc. Date: [id number] Attending:  Caleb Popp Dictator:   Earley Favor, RN, MSN, ACNP CC:         Dr. Dareen Piano at [facility], [place], [place]  Dr. Tressie Stalker   Discharge Summary  DATE OF BIRTH:  [birth year]  DISCHARGE DIAGNOSES: 1. Pulmonary embolism. 2. Right upper lobe cavitary mass. 3. Hypertension.  HISTORY OF PRESENT ILLNESS:  Timothy [name] is a 77 year old man who complained of chest pain and shortness of breath that started approximately five days prior to admission.  He was seen by his medical doctor and treated for indigestion and discharged home.  Patient stated that his pain was not relieved, described as pressure, and started in his right shoulder blade.  It became worse with breathing and was pleuritic in nature.  The pain did not subside, so he went to [facility] where he was found to have a pulmonary embolism, thoracic aortic aneurysm by CAT scan.  Patient also underwent a stress test on this date and was found to be normal.  Because of his pulmonary embolism, thoracic aortic aneurysm, Timothy [name] was transferred to [facility] in the care of Dr. Tressie Stalker for treatment of his thoracic embolism and pulmonary emboli.  LABORATORY DATA:  INR day of discharge on July 31 is 2.8, WBC 6.2, hemoglobin 13.4, hematocrit 38.9, platelets 403, PTT 169.  Arterial blood gases on room air - pH 7.44, PCO2 36, PO2 64.  Sodium 138, potassium 4.1, chloride 106, CO2 24, glucose 97, BUN 12, creatinine 1.1, calcium 8.8.  Acid fast bacteria smear shows no AFB.  Respiratory culture shows normal oropharyngeal flora.  Fungus smear is in progress.  Surgical pathology demonstrates bronchial tissue displaying moderate acute and chronic inflammation associated with focal fibrosis.  The bronchial epithelium was  partially denuded with areas possibly representing squamous metaplasia with slight atypia.  Lung parenchyma displays slight thickening of the alveolar septa with reactive pneumocytic changes and chronic inflammatory infiltrates.  Cytopathology - acute suppurative inflammation associated with benign reactive respiratory epithelium and ______, no atypia or evidence of malignancy is identified.  RADIOGRAPHIC DATA:  Chest x-ray following fiberoptic bronchoscopy shows no pneumothorax.  Chest x-ray shows COPD, mild bronchitic changes.  CT of the chest with contrast shows pulmonary embolic disease with ______ embolism within the arteries of the right lower lobe, right upper lobe, and left upper lobe.  Old thrombosed aortic dissection extended from the arch to the mid descending thoracic aorta.  Question of right hilar adenopathy versus a perihilar mass, suspect adenopathy.  Several small nodes were also noted to be in precarinal right peritracheal region, cavitary focus of 4 cm dominant in the right upper lobe, suspect post inflammatory; however, a cavitary neoplasm cannot be excluded.  PROCEDURE:  Fiberoptic bronchoscopy performed by Dr. Shan Levans on July 26, 2000, demonstrates a right upper lobe cavitary lesion with associated pulmonary embolism state, a negative PPD, evaluate for malignancy, follow up pathology and microbiology.  HOSPITAL COURSE: #1 - PULMONARY EMBOLISM:  Timothy [name] was started on normal anticoagulation therapy.  He was therapeutic over lab x 3 days and was ready for discharge on July 31, 2000.  He underwent a fiberoptic bronchoscopy that showed a right upper lobe cavitary lesion.  This  will be reevaluated in three months with a follow-up chest x-ray.  Results of cytopathology has been reported in laboratory findings.  Again, he was started on Coumadin therapy, followed at the [facility] where his INRs will be checked there and relayed to Dr. Florene Route office.  He  will follow up with his primary care physician, Dr. Dareen Piano, in the near future.  #2 - HYPERTENSION:  Hypertension was addressed with change in his medication to Lopressor 50 mg one b.i.d. and Altace 5 mg one q.d.  #3 - THORACIC ANEURYSM:  He initially was admitted to [facility] by Dr. Tressie Stalker of the cardiovascular thoracic surgery service.  He was transferred to the pulmonary critical care service for fiberoptic bronchoscopy.  Once his lung mass has been reevaluated in approximately three months, his thoracic aneurysm can be addressed by the cardiovascular thoracic service.  MEDICATIONS: 1. Coumadin 5 mg one q.d. 2. Lopressor 50 mg one q.d. 3. Altace 5 mg one q.d.  DIET:  Low-salt diet.  SPECIAL INSTRUCTIONS:  He is to go to the [facility] on August 2 at 10:00 a.m. to have INR drawn.  He will follow up with nurse practitioner Minor on August 8 at 2:30 p.m. and with Dr. Delford Field at the end of August.  DISPOSITION/CONDITION ON DISCHARGE:  Pulmonary embolism has been addressed with anticoagulation.  The right upper lobe mass has undergone evaluation and will be reevaluated in approximately three months with CT scan.  Thoracic aneurysm remains stable and will be evaluated by Dr. Tressie Stalker in the near future.  Hypertension has been brought under better control with a change in medications as noted on the [facility]. DD:  07/31/00 TD:  07/31/00 Job: [id number]

## 2010-05-19 NOTE — Discharge Summary (Signed)
[name], [name]               ACCOUNT NO.:  [id number]   MEDICAL RECORD NO.:  [id number]          PATIENT TYPE:  INP   LOCATION:  3023                         FACILITY:  [facility]   PHYSICIAN:  Danae Orleans. Venetia Maxon, M.D.  DATE OF BIRTH:  08-03-1920   DATE OF ADMISSION:  05/01/2006  DATE OF DISCHARGE:  05/07/2006                               DISCHARGE SUMMARY   REASON FOR ADMISSION:  Thoracic disc herniation with paraplegia, urinary  retention, hypertension, osteoarthritis, history of colonic polyps,  cardiac dysrhythmias, personal history of venous thrombosis and  embolism, and abdominal aortic aneurysm.   FINAL DIAGNOSES:  Thoracic disc herniation with paraplegia, urinary  retention, hypertension, osteoarthritis, history of colonic polyps,  cardiac dysrhythmias, personal history of venous thrombosis and  embolism, and abdominal aortic aneurysm.   HISTORY OF ILLNESS AND HOSPITAL COURSE:  Timothy [name] was admitted to  the hospital from the office.  On May 01, 2006, he had come back to  the office after lumbar decompression at L3-4 and L4-5 with bilateral  foraminotomies, getting much weaker in his legs, and had not progressed  in rehabilitation, was not able to ambulate.  He therefore had an  emergent MRI of his thoracic spine and lumbar spine, which demonstrated  thoracic spinal cord compression at the T7-8 level.  The patient was  admitted, placed on steroids, and was taken to surgery on May 03, 2006  for a T7-8 transpedicular discectomy with microdissection.  He had  significant improvement in lower extremity function.  Did have some ST  elevations and lethargy, so he was observed.  Was not felt to have  significant cardiac issues.  He was also evaluated by the CVTS service  for type 3 dissection 6 years ago, and was last followed by Dr. [name].  It was recommended that he followup here with Dr. [name] in 6 months with  a contrast CT angiogram of the thoracic aorta.  The patient was  gradually mobilized and was transferred to the rehabilitation service  for inpatient rehabilitation on May 07, 2006.      Danae Orleans. Venetia Maxon, M.D.  Electronically Signed     JDS/MEDQ  D:  06/20/2006  T:  06/20/2006  Job:  [id number]

## 2010-05-19 NOTE — Op Note (Signed)
[name], [name]               ACCOUNT NO.:  [id number]   MEDICAL RECORD NO.:  [id number]          PATIENT TYPE:  INP   LOCATION:  3041                         FACILITY:  [facility]   PHYSICIAN:  Danae Orleans. Venetia Maxon, M.D.  DATE OF BIRTH:  06-12-1920   DATE OF PROCEDURE:  03/26/2006  DATE OF DISCHARGE:                               OPERATIVE REPORT   PREOPERATIVE DIAGNOSIS:  Lumbar stenosis, spondylolisthesis,  spondylosis, degenerative disk disease and radiculopathy, L3-4 and L4-5  levels.   POSTOPERATIVE DIAGNOSIS:  Lumbar stenosis, spondylolisthesis,  spondylosis, degenerative disk disease and radiculopathy, L3-4 and L4-5  levels.   PROCEDURE:  Bilateral laminoforaminotomies at L3-4 and L4-5 with  microdissection.   SURGEON:  Danae Orleans. Venetia Maxon, M.D.   ASSISTANTMichail Jewels, RN, and Hewitt Shorts, M.D.   ANESTHESIA:  General endotracheal anesthesia.   BLOOD LOSS:  Blood loss less than 50 mL.   COMPLICATIONS:  None.   DISPOSITION:  To recovery.   INDICATIONS:  Timothy [name] is an 77 year old man with severe spinal  stenosis, spondylolisthesis of L4 and L5, with near complete canal block  at L3-4 and L4-5 levels.  It was he was admitted through the emergency  room as he was unable to stand or walk because of this the severity of  his lower extremity weakness.  It was elected to take him to surgery for  decompressive a laminectomy for stenosis.  Although he has  spondylolisthesis of L4 on L5, he is not complaining of any back pain  and given his age of 69, it was elected not to perform any type of  fusion operation.   PROCEDURE:  Timothy [name] is brought to the operating room.  Following the  satisfactory and uncomplicated option of general endotracheal anesthesia  and placement of intravenous lines, the patient was placed in the prone  position on the Wilson frame.  His [name] back was then shaved, prepped and  draped in the usual sterile fashion.  The area of planned incision  was  infiltrated with 0.25% Marcaine and 0.5% lidocaine with 1:200,000  epinephrine.  Incision was made in the midline, carried through to the  lumbodorsal fascia, which was incised bilaterally.  Subperiosteal  dissection was performed exposing the L3-4 and L4-5 interspaces and  intraoperative x-ray demonstrated marker probes at L2-3, L3-4, and L4-5  levels.  Subsequently the exposure was performed at the L4-5 and L3-4  levels and a self-retaining retractor was placed.  Because of the  spondylolisthesis, it was elected to perform bilateral laminotomies  without removal of the spinous processes and posterior ligamentous  structures.  Using a high-speed drill and acorn bit, the laminotomies  were performed at L3-4 bilaterally and L4-5 bilaterally and extremely  hypertrophied ligamentous tissue was removed with resultant  decompression of the thecal sac, and both the L3, L4 and L5 nerve roots  were widely decompressed as they extended out the neural foramina.  The  thecal sac was well-decompressed bilaterally and lateral recesses were  also decompressed.  A significant amount of hypertrophied ligamentous  tissue was removed to facilitate this decompression.  Subsequently  under  microscopic visualization the extent of decompression was confirmed and  ball probes were easily inserted through the neural foramina, and there  did not appear to be a persistent lateral recess stenosis.  Hemostasis  was assured and the wound was irrigated with bacitracin and saline.  Then the lumbar dorsal fascia was reapproximated with 0 Vicryl sutures,  subcutaneous tissues were reapproximated with 2-0 Vicryl interrupted  inverted sutures, and the skin edges were reapproximated with  interrupted 3-0 Vicryl subcuticular stitch.  The wound was dressed with  Benzoin, Steri-Strips, Telfa gauze and tape.  The patient was extubated  in the operating room taken to the recovery room in stable and  satisfactory condition,  having tolerated his operation well.      Danae Orleans. Venetia Maxon, M.D.  Electronically Signed     JDS/MEDQ  D:  03/26/2006  T:  03/26/2006  Job:  [id number]

## 2010-05-19 NOTE — H&P (Signed)
[name], [name]               ACCOUNT NO.:  [id number]   MEDICAL RECORD NO.:  [id number]          PATIENT TYPE:  INP   LOCATION:  3041                         FACILITY:  [facility]   PHYSICIAN:  Danae Orleans. Venetia Maxon, M.D.  DATE OF BIRTH:  12/13/1920   DATE OF ADMISSION:  03/25/2006  DATE OF DISCHARGE:                              HISTORY & PHYSICAL   REASON FOR ADMISSION:  Patient cannot stand or walk with a history of  severe spinal stenosis.   HISTORY OF PRESENT ILLNESS:  Timothy [name] is an 77 year old man with  hypertension, who is unable to stand or walk.  His legs have been giving  out on him over the last several days and it had been worsening more  recently.  He has had a greater than eight-year history of severe spinal  stenosis for which he has been recommended surgery on multiple occasions  but has not done so.  He is now able to stand at most 1-2 minutes with a  walker.  He has been seen by Dr. Phoebe Perch and Dr. Gerrit Heck.  He came to the  emergency room today for this reason.   PAST MEDICAL HISTORY:  Significant for hypertension, pernicious anemia,  and arthritis.  Additionally, he has had rhinitis, abnormal chest x-ray,  osteoarthritis, history of colon polyps, mild varicosities with edema,  and surgically had left wrist fracture, left pelvis fracture,  tonsillectomy, and appendectomy.   CURRENT MEDICATIONS:  1. Vitamin B12 1000 mg every 6 weeks.  2. Lisinopril with HCT 20/12.5 mg daily.  3. Metoprolol 25 mg twice a day.  4. Aspirin 81 mg daily.  5. Osteo Bi-Flex.  6. Os-Cal.  7. [facility] multivitamins.   ALLERGIES:  NO KNOWN DRUG ALLERGIES.   PHYSICAL EXAMINATION:  GENERAL:  The patient is awake, alert, and  oriented.  VITAL SIGNS:  Temperature is 97.1, blood pressure 135/67, pulse of 48,  respiratory rate of 12.  BACK:  He has no back pain to palpation or percussion.  EXTREMITIES:  He has full strength in his upper extremities bilaterally,  symmetric in all motor groups.   Full strength in both lower extremities  bilaterally, symmetric in all motor groups with the exception of right  greater than left extensor hallucis longus strength which is 4/5.  He  has decreased sensation to light touch in both lower extremities in L5-  S1 distribution.  Reflexes are 2 in the biceps, triceps, and  brachioradialis, 1 at the ankles.  Toes are downgoing to plantar  stimulation.  He complains of sciatic notch discomfort to palpation.  CHEST:  Clear to auscultation.  HEART:  Regular rate and rhythm without murmur.  ABDOMEN:  Soft, nontender.  Active bowel sounds.  No hepatosplenomegaly  appreciated.  Mild edema in the lower extremities without clubbing, or  cyanosis.   IMPRESSION:  Timothy [name] is an 76 year old man with spinal stenosis.  He will be admitted to 3000.  He will need decompressive laminectomy for  spinal stenosis.  This is being set up for as soon as possible.  Get PSA  on admission.  Danae Orleans. Venetia Maxon, M.D.  Electronically Signed     JDS/MEDQ  D:  03/25/2006  T:  03/26/2006  Job:  [id number]

## 2011-01-30 ENCOUNTER — Observation Stay: Payer: Self-pay | Admitting: Internal Medicine

## 2011-01-30 LAB — COMPREHENSIVE METABOLIC PANEL
Albumin: 4 g/dL (ref 3.4–5.0)
Alkaline Phosphatase: 73 U/L (ref 50–136)
BUN: 31 mg/dL — ABNORMAL HIGH (ref 7–18)
Creatinine: 1.87 mg/dL — ABNORMAL HIGH (ref 0.60–1.30)
Glucose: 203 mg/dL — ABNORMAL HIGH (ref 65–99)
SGOT(AST): 28 U/L (ref 15–37)
SGPT (ALT): 21 U/L
Total Protein: 7.6 g/dL (ref 6.4–8.2)

## 2011-01-30 LAB — CBC
MCH: 35.5 pg — ABNORMAL HIGH (ref 26.0–34.0)
Platelet: 156 10*3/uL (ref 150–440)
RBC: 4.4 10*6/uL (ref 4.40–5.90)

## 2011-05-24 ENCOUNTER — Emergency Department: Payer: Self-pay | Admitting: Emergency Medicine

## 2011-05-24 LAB — CBC
HCT: 43.1 % (ref 40.0–52.0)
HGB: 14.3 g/dL (ref 13.0–18.0)
Platelet: 144 10*3/uL — ABNORMAL LOW (ref 150–440)
RBC: 4.1 10*6/uL — ABNORMAL LOW (ref 4.40–5.90)

## 2011-05-24 LAB — COMPREHENSIVE METABOLIC PANEL
Alkaline Phosphatase: 100 U/L (ref 50–136)
Anion Gap: 9 (ref 7–16)
BUN: 21 mg/dL — ABNORMAL HIGH (ref 7–18)
Chloride: 101 mmol/L (ref 98–107)
Creatinine: 0.99 mg/dL (ref 0.60–1.30)
EGFR (African American): >60
EGFR (Non-African Amer.): >60
Osmolality: 277 (ref 275–301)
Potassium: 3.5 mmol/L (ref 3.5–5.1)
SGPT (ALT): 16 U/L
Total Protein: 7 g/dL (ref 6.4–8.2)

## 2011-06-03 ENCOUNTER — Inpatient Hospital Stay: Payer: Self-pay | Admitting: Internal Medicine

## 2011-06-03 LAB — URINALYSIS, COMPLETE
Bacteria: NONE SEEN
Bilirubin,UR: NEGATIVE
Glucose,UR: NEGATIVE mg/dL (ref 0–75)
Ketone: NEGATIVE
Protein: NEGATIVE
Specific Gravity: 1.008 (ref 1.003–1.030)
Squamous Epithelial: NONE SEEN
WBC UR: <1 /HPF (ref 0–5)

## 2011-06-03 LAB — CBC
Platelet: 134 10*3/uL — ABNORMAL LOW (ref 150–440)
RBC: 4.17 10*6/uL — ABNORMAL LOW (ref 4.40–5.90)

## 2011-06-03 LAB — TROPONIN I
Troponin-I: <0.02 ng/mL
Troponin-I: <0.02 ng/mL

## 2011-06-03 LAB — BASIC METABOLIC PANEL
Calcium, Total: 8.4 mg/dL — ABNORMAL LOW (ref 8.5–10.1)
Co2: 25 mmol/L (ref 21–32)
Creatinine: 1.12 mg/dL (ref 0.60–1.30)
EGFR (Non-African Amer.): 58 — ABNORMAL LOW
Glucose: 106 mg/dL — ABNORMAL HIGH (ref 65–99)
Sodium: 137 mmol/L (ref 136–145)

## 2011-06-03 LAB — TSH: Thyroid Stimulating Horm: 13.3 u[IU]/mL — ABNORMAL HIGH

## 2011-06-04 LAB — BASIC METABOLIC PANEL
BUN: 23 mg/dL — ABNORMAL HIGH (ref 7–18)
Calcium, Total: 8.1 mg/dL — ABNORMAL LOW (ref 8.5–10.1)
Chloride: 102 mmol/L (ref 98–107)
Co2: 29 mmol/L (ref 21–32)
EGFR (Non-African Amer.): >60
Glucose: 89 mg/dL (ref 65–99)
Potassium: 3.5 mmol/L (ref 3.5–5.1)

## 2011-06-04 LAB — CBC WITH DIFFERENTIAL/PLATELET
Basophil #: 0 10*3/uL (ref 0.0–0.1)
Eosinophil %: 1.7 %
HCT: 40.2 % (ref 40.0–52.0)
Lymphocyte %: 41.1 %
Monocyte #: 0.7 x10 3/mm (ref 0.2–1.0)
Neutrophil %: 47 %
Platelet: 128 10*3/uL — ABNORMAL LOW (ref 150–440)

## 2011-06-04 LAB — T4, FREE: Free Thyroxine: 0.79 ng/dL (ref 0.76–1.46)

## 2011-06-04 LAB — TROPONIN I: Troponin-I: <0.02 ng/mL

## 2011-06-07 ENCOUNTER — Encounter: Payer: Self-pay | Admitting: Internal Medicine

## 2011-07-02 ENCOUNTER — Encounter: Payer: Self-pay | Admitting: Internal Medicine

## 2012-02-02 ENCOUNTER — Ambulatory Visit: Payer: Self-pay | Admitting: Internal Medicine

## 2012-02-14 ENCOUNTER — Inpatient Hospital Stay: Payer: Self-pay | Admitting: Internal Medicine

## 2012-02-14 LAB — COMPREHENSIVE METABOLIC PANEL
Alkaline Phosphatase: 103 U/L (ref 50–136)
Anion Gap: 15 (ref 7–16)
BUN: 39 mg/dL — ABNORMAL HIGH (ref 7–18)
Calcium, Total: 8.9 mg/dL (ref 8.5–10.1)
Creatinine: 2.07 mg/dL — ABNORMAL HIGH (ref 0.60–1.30)
EGFR (African American): 32 — ABNORMAL LOW
EGFR (Non-African Amer.): 28 — ABNORMAL LOW
Glucose: 104 mg/dL — ABNORMAL HIGH (ref 65–99)
Osmolality: 311 (ref 275–301)
SGOT(AST): 37 U/L (ref 15–37)
SGPT (ALT): 16 U/L (ref 12–78)

## 2012-02-14 LAB — TROPONIN I: Troponin-I: 0.08 ng/mL — ABNORMAL HIGH

## 2012-02-14 LAB — RAPID INFLUENZA A&B ANTIGENS

## 2012-02-14 LAB — CBC
HCT: 48.9 % (ref 40.0–52.0)
MCV: 100 fL (ref 80–100)
Platelet: 158 10*3/uL (ref 150–440)
WBC: 17.9 10*3/uL — ABNORMAL HIGH (ref 3.8–10.6)

## 2012-02-14 LAB — PRO B NATRIURETIC PEPTIDE: B-Type Natriuretic Peptide: 6968 pg/mL — ABNORMAL HIGH (ref 0–450)

## 2012-02-14 LAB — TSH: Thyroid Stimulating Horm: 3.23 u[IU]/mL

## 2012-02-14 LAB — PROTIME-INR: INR: 1.2

## 2012-02-14 LAB — CK TOTAL AND CKMB (NOT AT ARMC)
CK, Total: 339 U/L — ABNORMAL HIGH (ref 35–232)
CK, Total: 454 U/L — ABNORMAL HIGH (ref 35–232)
CK-MB: 11.1 ng/mL — ABNORMAL HIGH (ref 0.5–3.6)

## 2012-02-15 LAB — BASIC METABOLIC PANEL
Anion Gap: 8 (ref 7–16)
Calcium, Total: 8.4 mg/dL — ABNORMAL LOW (ref 8.5–10.1)
Chloride: 119 mmol/L — ABNORMAL HIGH (ref 98–107)
Creatinine: 1.54 mg/dL — ABNORMAL HIGH (ref 0.60–1.30)
Glucose: 152 mg/dL — ABNORMAL HIGH (ref 65–99)
Osmolality: 311 (ref 275–301)
Potassium: 3.3 mmol/L — ABNORMAL LOW (ref 3.5–5.1)

## 2012-02-15 LAB — CBC WITH DIFFERENTIAL/PLATELET
Eosinophil #: 0.3 10*3/uL (ref 0.0–0.7)
HGB: 15.1 g/dL (ref 13.0–18.0)
Lymphocyte %: 21 %
MCH: 33.1 pg (ref 26.0–34.0)
MCHC: 33.1 g/dL (ref 32.0–36.0)
MCV: 100 fL (ref 80–100)
Monocyte #: 0.9 x10 3/mm (ref 0.2–1.0)
Monocyte %: 8.1 %
Neutrophil #: 7.3 10*3/uL — ABNORMAL HIGH (ref 1.4–6.5)
Neutrophil %: 67.6 %
Platelet: 126 10*3/uL — ABNORMAL LOW (ref 150–440)
RBC: 4.56 10*6/uL (ref 4.40–5.90)

## 2012-02-15 LAB — CK TOTAL AND CKMB (NOT AT ARMC): CK, Total: 695 U/L — ABNORMAL HIGH (ref 35–232)

## 2012-02-15 LAB — MAGNESIUM: Magnesium: 1.5 mg/dL — ABNORMAL LOW

## 2012-02-20 LAB — CULTURE, BLOOD (SINGLE)

## 2012-03-01 ENCOUNTER — Ambulatory Visit: Payer: Self-pay | Admitting: Internal Medicine

## 2012-03-01 DEATH — deceased

## 2013-05-31 IMAGING — CR DG CHEST 1V PORT
1 series · 1 of 1 positions shown · non-contrast
Comparison: none

REASON FOR EXAM: sob
COMMENTS:

[ap]
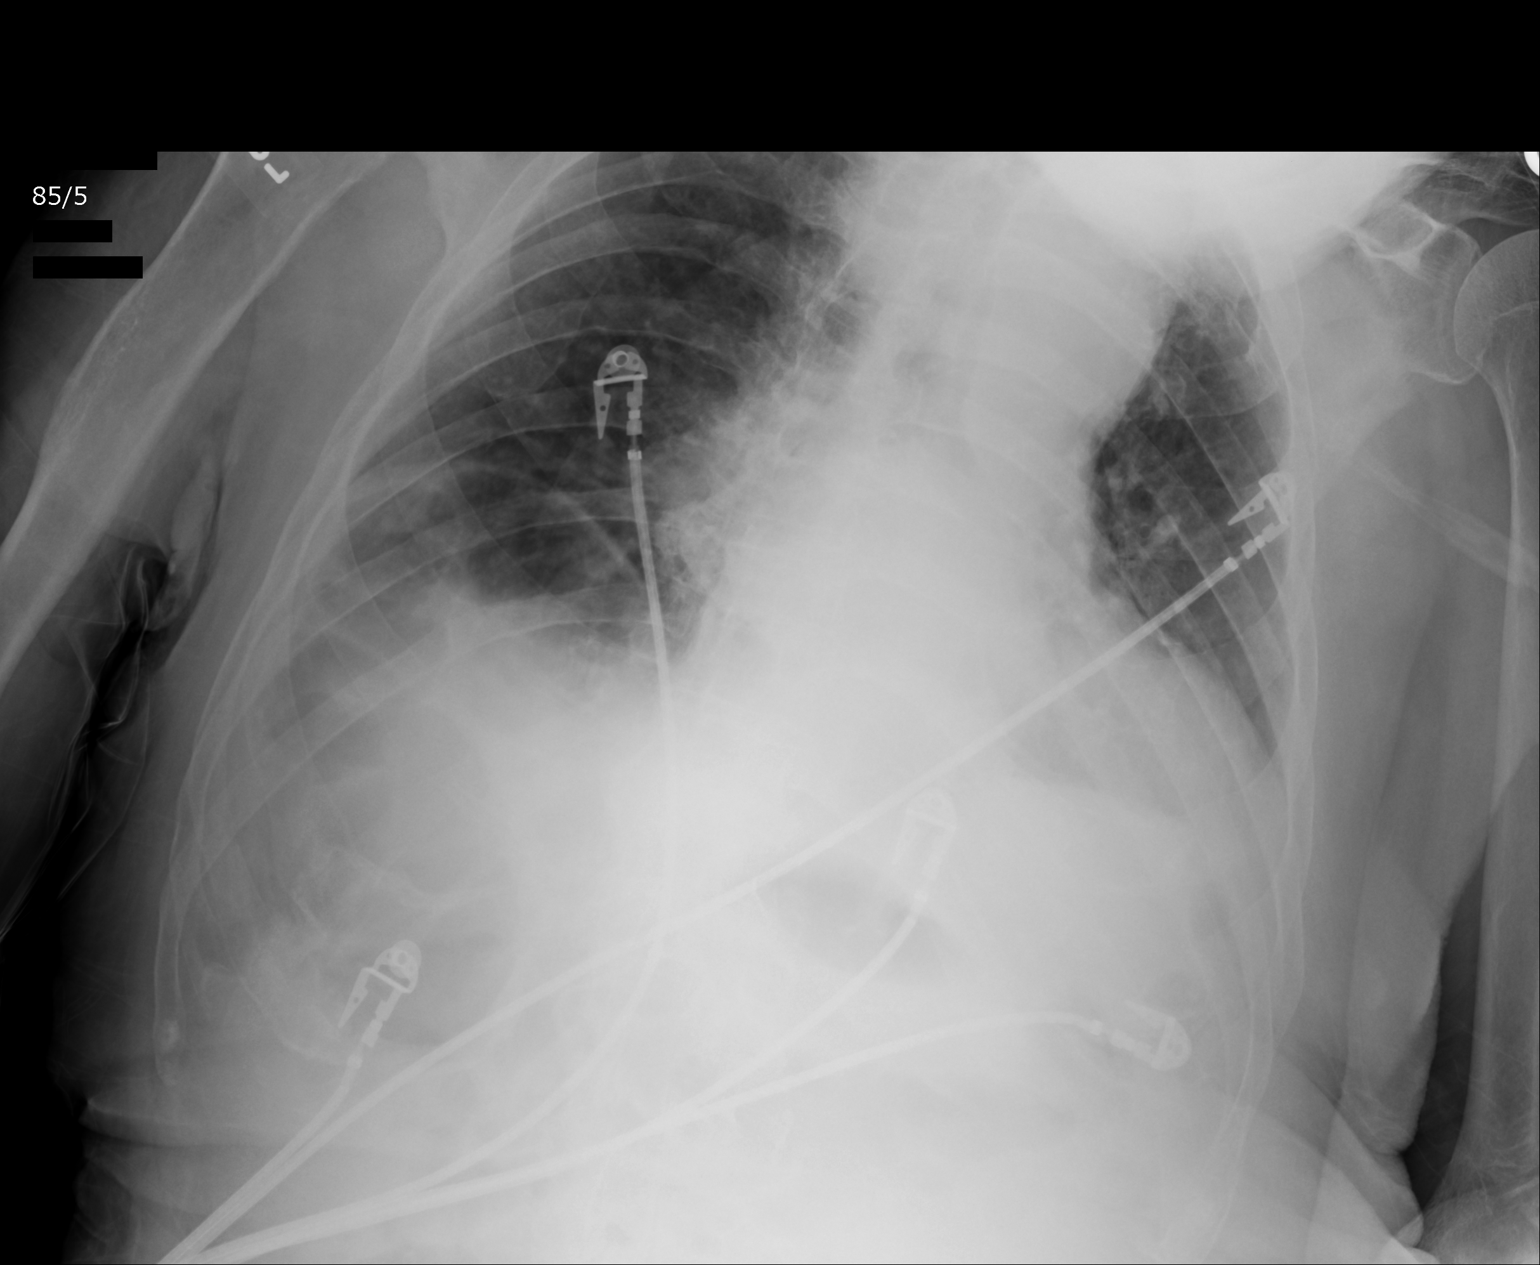

[1 of 1 positions shown; findings below may reference images not displayed]

PROCEDURE:     DXR - DXR PORTABLE CHEST SINGLE VIEW  - February 14, 2012 [DATE]

RESULT:     Comparison is made to the exam dated to June 2011.

The patient is rotated toward the left. The heart is mildly enlarged but
unchanged. The chin projects over the left lung apex. There is increased
density at the right lung base which could represent infiltrate or
atelectasis. No large effusion is evident.
IMPRESSION: 1. Stable cardiomegaly. Right lung base atelectasis versus infiltrate.

[REDACTED]

## 2014-04-23 NOTE — H&P (Signed)
PATIENT NAME:  Timothy [name], [name]:  [id number] DATE OF BIRTH:  November 25, 1920  DATE OF ADMISSION:  02/14/2012  REFERRING PHYSICIAN:  Sheryl L. Mindi JunkerGottlieb, MD  REASON FOR ADMISSION:  Severe respiratory distress, acute respiratory failure and pneumonia.   PRIMARY CARE PHYSICIAN:  Marya AmslerMarshall W. Dareen PianoAnderson, MD  HISTORY OF PRESENT ILLNESS:  The patient is a nice 81 year old gentleman who was admitted back here in June of last year due to pneumonia. After that, he went to a skilled nursing facility and then transferred to [facility]. The patient has been there in good condition, but has significant deterioration due to his dementia. He has been put on hospice due to his advanced dementia and today he comes with a history of being more short of breath. Apparently for the past 5 to 6 days, he started having low-grade fever, shortness of breath. The patient developed significant sputum and cough and the wife wanted him to be checked over here and treated at this moment. He was found to be really uncomfortable with severe respiratory distress for which he was put on BiPAP. After evaluation, the family wanted me to treat him completely except for resuscitation measurements or heroic measurements. They were okay with BiPAP and IV fluids, but now after talking with them again and looking at the patient being so uncomfortable, we decided to take him off the BiPAP and only give him antibiotics, fluids, medications and give him at least 24 hours to see what his response is going to be to all this and if there is no response, they will consider to go into comfort care. They do not want to put him in the CCU at this moment.   REVIEW OF SYSTEMS:  Unable to obtain due to severe dementia and respiratory distress. The family told me that the patient had a fever and cold symptoms with chills and there is a lot of flu going on in the facility that he is at. He had sputum. He had shortness of breath for 5 to 6 days. He was seen by his  doctor on hospice and there were x-rays done. They told the family that he did not have any pneumonia or other issues. The patient has been put on oral antibiotics. The family states that overall he has been having severe deterioration within the past week. He is not able to eat or drink anything. The last time he walked was about 3 weeks ago. He used to walk with a walker. His mentation has been on and off, but overall, his recognizes the family. Up until last 2 days, he has been very lethargic. Again, I cannot get full review of systems due to his severe problems.   PAST MEDICAL HISTORY:   1.  Hypertension.  2.  Pernicious anemia.  3.  History of allergic rhinitis.  4.  Osteoarthritis.  5.  Hypertension.  6.  Benign prostatic hypertrophy.  7.  Lumbar spine stenosis.  8.  GERD.  9.  Dementia.  10.  Thrush.   PAST SURGICAL HISTORY:  Left wrist fracture, left pelvis fracture, tonsillectomy, appendectomy, lumbar laminectomy, thoracic spine laminectomy.   CURRENT MEDICATIONS:  Include vitamin B12 at 1000 mcg once a day, Tylenol, senna plus, Robitussin, propranolol 60 mg once daily, prochlorperazine 10 mg, Zofran, omeprazole, nystatin, Norco, Milk of magnesia, melatonin, Maalox, lorazepam, levothyroxine 50 mcg once a day, aspirin, amlodipine 2.5 mg once daily.   ALLERGIES:  APPARENTLY TO ACE INHIBITORS, BUT HERE there is NO KNOWN DRUG ALLERGIES AT  THIS MOMENT.   FAMILY HISTORY:  Positive for MI in his mother who died at age of 22, diabetes and COPD.   SOCIAL HISTORY:  The patient does not smoke, does not drink. He is currently living at [facility]. He is married. They do not have any children. He is a retired Curator.   PHYSICAL EXAMINATION: VITAL SIGNS: Blood pressure of 120/57, pulse 140 up to 148, respirations 20, temperature 98.4.  GENERAL: The patient's is critically ill on a BiPAP looking very dehydrated. The patient is in acute distress with use of accessory muscles.  HEENT: His  pupils are equal and reactive. Extraocular movements are intact. Very dry mucosa with thrush. No oral lesions.  NECK: Supple. No JVD. No thyromegaly. No adenopathy. No carotid bruits. No rigidity.  CARDIOVASCULAR: Irregularly irregular, very tachycardic. No rubs or gallops.  PULMONARY: Very congested with rales on the right lower lobe and some rhonchi diffuse in both lungs. No displacement of PMI. Positive use of accessory muscles especially when patient is taken off the BiPAP. He is very tachypneic.  ABDOMEN: Soft, nontender, nondistended, no hepatosplenomegaly. No masses. Bowel sounds are positive.  EXTREMITIES: No edema, no cyanosis, no clubbing. Cold extremities. Pulses +2. Capillary refill is less than 3.  SKIN: No rashes or petechiae on the skin.  NEUROLOGIC: Cranial nerves grossly intact. The patient moves 4 extremities, but is lethargic and confused.  PSYCHIATRIC: The patient is pleasantly confused, but lethargic.   LYMPHATIC: Negative for lymphadenopathy in neck or supraclavicular areas.  MUSCULOSKELETAL: No significant joint effusions or joint deformity.   DIAGNOSTIC DATA:  Glucose 104, BNP is 6900 (in the past it has been 3000, although the patient is clinically dehydrated), BUN 39, creatinine 2.07, sodium 152, potassium 3.4, chloride 116. GFR is less than 20. Bilirubin is 1.7. His CK is 339 and MB is 6.6. Troponin is 0.07. White blood count 17,000, hemoglobin 16. PT is 15.2 and INR 1.2. EKG: Atrial fibrillation with RVR. No ST depression or elevation. Chest x-ray: Possible aspiration pneumonia on the right side.   ASSESSMENT AND PLAN:  An 81 year old gentleman with history of pneumonia in the past, pernicious anemia, hypertension, benign prostatic hypertrophy, severe osteoarthritis who comes with significant dementia as well. He is on hospice care. The family brought him over here due to severe respiratory distress and acute respiratory failure. They want treatment for his current  condition, pneumonia. They are very realistic and if the patient is not improving within the next 24 hours, they will consider just do full comfort care. For now, they want me to treat his major conditions. They are not concerned about the patient having a heart attack and they do not want him to be on the Critical Care Unit at this moment despite the fact that he is in atrial fibrillation with rapid ventricular response. We took him off of the BiPAP actually and now the patient is very tachypneic and in respiratory distress, but he said that he did not want to have the BiPAP. We are also not going to put him on full anticoagulation and he is not going to go to the Critical Care Unit. The patient might have increased troponins due his acute kidney injury.  1.  Acute respiratory failure. The patient was put on BiPAP and then taken off. Right now, he is on supplemental oxygen. This is likely due to pneumonia.  2.  Pneumonia. The patient has healthcare-associated pneumonia. We are going to treat him with Zosyn, vancomycin and Levaquin and  follow up his response. Blood cultures have been taken. Followup chest x-ray in the morning.  3.  Atrial fibrillation with rapid ventricular response. The patient has new atrial fibrillation. He has never had it before. We are going to put him on metoprolol intravenously, schedule every 4 hours and monitor his heart rate. They do not want him to go to the Critical Care Unit. If it was the case, he would have been on a drip, eitheramidatone or Cardizem, but at this moment, we are going to try to keep him outside the unit.  4.  Hypertension. At this moment, his blood pressure is stable. Hold other medications. Continue only with metoprolol.  5.  Dysphagia. The patient has not been able to swallow for the past several days due to his lethargy. He has possible aspiration. We are going to get speech therapy to evaluate him before feeding him. We are going to do most of his medications  intravenously.  6.  Hypothyroidism. The patient has history of hypothyroidism. He needs to be put on Synthroid. I am going to check his TSH to make sure that we are not over treating him as the patient is so tachycardic. Likely, his tachycardia is due to his infection.  7.  Systemic inflammatory response syndrome/sepsis. The patient is looking very ill and looks toxic. He has tachycardia. He has decreased oxygen saturation, tachypnea, increased white blood cell of 17,000.  8.  Acute kidney injury. The patient has elevated creatinine from a baseline of around 1 to 2.7. We are going to give him gentle intravenous fluids.  9.  Hypernatremia likely due to dehydration. We are going to change his intravenous fluids to D-5 1/4 normal saline.  10.  Hypokalemia. His potassium is low and we are going to replace.  11.  Other medical problems are stable. His dementia is stable.   The patient is DO NOT RESUSCITATE/DO NOT INTUBATE, but for now he wants treatment.   TIME SPENT:  I spent about 60 minutes with this patient and the family.   CRITICAL CARE TIME:  60 minutes. We are going to sign off the care to Dr. Dareen Piano later on.    ____________________________ Felipa Furnace, MD rsg:si D: 02/14/2012 14:31:00 ET T: 02/14/2012 15:11:52 ET JOB#: [id number]  cc: Felipa Furnace, MD, <Dictator> Adron Geisel Juanda Chance MD ELECTRONICALLY SIGNED 02/20/2012 14:18

## 2014-04-25 NOTE — H&P (Signed)
PATIENT NAME:  Timothy [name], Timothy [name] MR#:  [id number] DATE OF BIRTH:  01-21-1920  DATE OF ADMISSION:  06/03/2011  PRIMARY CARE PHYSICIAN: Dr. Dareen Piano   HISTORY OF PRESENT ILLNESS: Patient is an 81 year old Caucasian male with past medical history significant for history of dementia, pernicious anemia, hyperlipidemia, benign hypertension presented to the hospital with complaints of falls over the past one week as well as complaints of right hip pains. Apparently patient has been having problems with right hip pains and he was seen by Dr. Dareen Piano since beginning of May and he was also seen by Mr. Clyde Canterbury, was initiated on Tylenol No. 3 as well as given prednisone taper, however, with no significant improvement in his right hip pain. According to patient's wife as patient himself is not able to provide much history due to dementia, his hip pain is so significant that he is not able to walk. He has been walking and his right leg has been giving out. He is landing on his knees and his wife is not able to lift him up. He is not able to walk even with his walker. His wife is not able even to place socks on his legs because usually patient screams with pain whenever his leg lifted even a little bit. Here, however, in the Emergency Room his legs are being lifted by Emergency Room physician as well as me myself and patient is not complaining of any significant pains, however, because of falls and patient's family's inability to take care of him at home hospitalist services were contacted for admission. Moreover patient's EKG was found to be abnormal. Patient was found to be in atrial fibrillation. He also was found to have lower extremity swelling and congestive heart failure on examination.   PAST MEDICAL HISTORY:  1. History of hypertension. 2. Rhinitis. 3. Pernicious anemia. 4. Abnormal chest x-ray.  5. Osteoarthritis in knee.   6. Lumbar spine with severe spinal stenosis.  7. Benign prostatic hypertrophy.   8. History of colon polyps.  9. Mild varicosities with lower extremity edema.   PAST SURGICAL HISTORY:  1. Left wrist fracture.  2. Left pelvis fracture. 3. Tonsillectomy. 4. Appendectomy. 5. Lumbar laminectomy. 6. Thoracic spine laminectomy.  MEDICATIONS:  1. Norvasc 10 mg p.o. daily. 2. Metoprolol 25 mg p.o. twice daily. 3. Cyanocobalamin which is vitamin B12 1000 mcg intramuscularly monthly.  4. Aspirin 81 mg p.o. daily.  5. Fish oil 1 tablet daily. 6. Centrum Silver once daily. 7. Os-Cal once daily. 8. Tylenol No. 3, 300/30 t.i.d.  9. Cyclobenzaprine twice daily.  10. Patient apparently was also given some steroid taper from 60 mg down to 0 mg.   ALLERGIES: ACE inhibitors which give him angioedema.   FAMILY HISTORY: Patient's mother died at the age of 38 of MI as well as diabetes. Patient's mother died at age of 53 of chronic obstructive pulmonary disease.   SOCIAL HISTORY: Patient does not smoke or use alcohol. Uses two cups of coffee a day. He is married. No children. Retired Curator.   REVIEW OF SYSTEMS: Difficult to obtain as patient is demented. He admits of some fatigue and weakness as well as apparently had right hip pains. He also admits of right lower extremity weakness. Admits of using bifocals for vision. Admits of runny nose as well as apparently he has some dyspnea on exertion intermittently according to patient's wife. No fevers or chills reported. No troubles otherwise. Has some runny nose. No cough. No pains reported in the chest. No  nausea, vomiting, diarrhea. Good appetite. No urination troubles. No endocrinologic troubles. No hematologic trouble reported by the patient's wife. Patient himself, as mentioned above, is not able to provide much history.   PHYSICAL EXAMINATION: VITAL SIGNS: On arrival to Emergency Room patient's vitals: Temperature 98.1, pulse 81, respiratory rate 18, blood pressure 148/75, saturation 98% on room air.   GENERAL: This is a  well-nourished Caucasian male in no significant distress lying on the stretcher.   HEENT: His pupils are equal, reactive to light. Extraocular movements are intact. No icterus or conjunctivitis. Has normal hearing. No pharyngeal erythema. Mucosa is moist.   NECK: Neck did not reveal any masses. Supple, nontender. Thyroid not enlarged. No adenopathy. No JVD or carotid bruits bilaterally. Full range of motion.   LUNGS: Crackles as well as rales bilaterally, few rhonchi were heard, somewhat diminished breath sounds bilaterally. No significant wheezing or labored inspirations, increased effort. No dullness to percussion. Not in overt respiratory distress.   CARDIOVASCULAR: S1, S2 appreciated. No murmurs, gallops, rubs noted. Irregular, irregular. PMI not lateralized. Chest is nontender to palpation. 2 to 3+ lower extremity edema around his ankles and lower third of tibia. No calf tenderness or cyanosis was noted.   ABDOMEN: Soft, nontender. Bowel sounds present. No hepatomegaly or masses were noted.   RECTAL: Deferred.   MUSCULOSKELETAL: Able to move all extremities. Diminished bilateral lower extremity strength 3/5. Patient has difficulty sitting up in the bed, needs help to sit up in the bed. He has mild kyphosis. Gait is not tested.   SKIN: Skin did not reveal any rashes, lesions. Patient did not have any erythema, nodularity, induration. Skin was warm and dry to palpation.   LYMPH: No adenopathy in cervical region.   NEUROLOGICAL: Cranial nerves grossly intact. Sensory difficult to obtain. Patient does not have dysarthria.   PSYCH: Patient is alert, not cooperative. His memory is impaired. His is not significantly confused but, however, not able to answer questions.   LABORATORY, DIAGNOSTIC AND RADIOLOGICAL DATA: Glucose 106, BUN slightly elevated at 26, B-type natriuretic peptide elevated at 3097, otherwise unremarkable labs. Troponin is less than 0.02 CBC: White blood cell count 9.6,  hemoglobin 14.6, platelet count 134, absolute neutrophil count is not measured. Urinalysis: Yellow clear urine, negative for glucose, bilirubin or ketones, specific gravity 1.008, pH 7.0, negative for blood, protein, nitrites, or leukocyte esterase, 1 red blood cells, less than 1 white blood cell, no bacteria or epithelial cells, mucous is present. CT scan of right hip without contrast 06/03/2011 revealed osteoarthritic changes without evidence of acute abnormalities. EKG showed atrial fibrillation with prematurely aberrantly conducted complexes at rate of 89 beats per minute, left axis deviation, low voltage QRS, possible anterior infarct age undetermined according to EKG criteria. Nonspecific ST-T changes, but no acute ST-T changes were noted. In compared to January 2013 no significant EKG changes except in the past was sinus bradycardia noted, however, now it is atrial fibrillation. Radiologic studies are pending.   ASSESSMENT AND PLAN:  1. Atrial fibrillation, new onset. Admit patient to medical floor. Start him on heparin IV. Check cardiac enzymes x3. Check TSH. Start patient on low dose of metoprolol. Get cardiologist involved. Get echocardiogram.  2. Hypertension. Continue metoprolol as well as Norvasc. We may need to be change Norvasc to hydralazine, Imdur after echocardiogram if echocardiogram shows cardiomyopathy.  3. Questionable congestive heart failure. BNP is markedly elevated. Get chest x-ray. Get echocardiogram. Will start patient on low dose of hydralazine, Imdur and will decrease Norvasc dose  to 5 mg daily dose.  4. Right hip pain. Will get ortho consult. Will continue pain medications at this time.  5. Thrombocytopenia. Follow with heparin injections. Get vitamin B12 levels as well as folic acid levels to rule out deficiencies.   TIME SPENT: 50 minutes.   ____________________________ Timothy Caperima Shanena Pellegrino, MD rv:cms D: 06/03/2011 15:13:00 ET T: 06/03/2011 15:39:10  ET JOB#: [id number]  cc: Timothy Caperima Darious Rehman, MD, <Dictator> Marya AmslerMarshall W. Dareen PianoAnderson, MD Timothy CaperIMA Donika Butner MD ELECTRONICALLY SIGNED 06/05/2011 19:07

## 2014-04-25 NOTE — H&P (Signed)
PATIENT NAME:  Timothy [name], Timothy [name] MR#:  [id number] DATE OF BIRTH:  28-Mar-1920  DATE OF ADMISSION:  01/30/2011  HISTORY OF PRESENT ILLNESS: Timothy [name] is an elderly white gentleman who was found this evening on his tractor by his wife. The patient was complaining of neck pain and headache. He was noted to be confused and to have angioedema of the lips. He was not having stridor. He also had multiple hives on his arms and legs. The patient was brought to the Emergency Room where head CT showed no acute changes. There was a question of a possible lytic lesion in the left parietal bone.   PAST MEDICAL HISTORY:  1. Hypertension.  2. Pernicious anemia. 3. Osteoarthritis.  4. Benign prostatic hypertrophy. 5. History of a repair of a left wrist fracture.  6. Pelvic fracture in the past.  7. Tonsillectomy.  8. Appendectomy.  9. Thoracic laminectomy. 10. Lumbar laminectomy.   MEDICATIONS:  1. Lisinopril 20/12.5 mg daily.  2. Norvasc 10 mg daily.  3. Metoprolol 50 mg one-half tablet b.i.d.  4. Vitamin B12 1000 mcg IM monthly.   FAMILY HISTORY: Noncontributory.   SOCIAL HISTORY: He is a retired Visual merchandiserfarmer. He does not smoke or drink.   REVIEW OF SYSTEMS: The patient really could not give a review of systems as he was hard of hearing and a very poor historian. His wife stated that he had been somewhat confused the morning before he even went out to get on his tractor.   ADMISSION PHYSICAL EXAMINATION:   GENERAL: Elderly gentleman who at the present time is in no acute distress. He is not shocky looking, however. He is not warm to the touch. He no longer has any angioedema of the lips. He does have some resolving hives on his arms and legs. There is no lymphadenopathy.   VITAL SIGNS: Recorded on the ER record.   HEENT: Relatively unremarkable.   NECK: Neck was supple. There was no JVD. There were no carotid bruits.   LUNGS: Clear to auscultation.   CARDIAC: There was a regular rhythm without  murmur or gallop. S1, S2 were normal.   ABDOMEN: Soft and nontender. There were no masses or organomegaly. Bowel sounds were normal.   EXTREMITIES: No edema or deformity.   NEUROLOGIC: Felt to be physiological.   IMPRESSION: Acute angioedema most likely secondary to ACE inhibitor.   PLAN: The patient is being admitted to the regular medical floor where he will be placed on observation to make sure he does not develop stridor overnight. He will be continued on his Norvasc and metoprolol. Lisinopril will be held. The patient will be evaluated in the morning by physical therapy.   ____________________________ Letta PateJohn B. Danne HarborWalker III, MD jbw:drc D: 01/30/2011 19:12:37 ET T: 01/31/2011 05:56:11 ET JOB#: [id number]  cc: Timothy RuizJohn B. Danne HarborWalker III, MD, <Dictator> Timothy PuttJOHN B WALKER III MD ELECTRONICALLY SIGNED 01/31/2011 20:39

## 2014-04-25 NOTE — Consult Note (Signed)
Brief Consult Note: Diagnosis: Right hip pain.   Patient was seen by consultant.   Recommend further assessment or treatment.   Orders entered.   Comments: Patient is an 81 y/o male who was complaining of right hip pain.  He is confused at the bedside, but the history is provided by family.  There have been multiple recent falls.  On exam, the patient is spontaneously moving his right lower extremity without pain.  He tolerates IR/ER with log-rolling without discomfort.  Pedal pulses are palpable.  There is intact skin over the right hip without erythema or ecchymosis.  Sensation can't be tested due to patient confusion. There is no significant lower extremity edema.  Plain films, CT and MRI or right hip are reviewed and demonstrate no evidence of severe OA or fracture or AVN.  Recommend PT evaluation in the AM.  Patient would likely benefit from a SNF stay.  No orthopaedic intervention at this time.  If patient fails to progress with PT, then he may require an MRI of his spine.  His family explains the patient has had 2 prior back operations by Dr. Venetia [place]Stern in [place].  He has had some incontinence recently and the falls may be a sign of lower extremity weakness attributed to a disc herniation or spinal stenosis.  This may need to be worked up further and the patient evaluated by spine surgeon if he fails to progress or demonstrates worsening neurologic function.  Will sign off for now.  Electronic Signatures: Juanell FairlyKrasinski, Daryus Sowash (MD)  (Signed 03-Jun-13 20:26)  Authored: Brief Consult Note   Last Updated: 03-Jun-13 20:26 by Juanell FairlyKrasinski, Yordin Rhoda (MD)

## 2014-04-25 NOTE — Discharge Summary (Signed)
PATIENT NAME:  Lynnda ChildBYERS, Laquon M MR#:  [id number] DATE OF BIRTH:  Feb 16, 1920  DATE OF ADMISSION:  06/03/2011 DATE OF DISCHARGE:  06/06/2011   DISCHARGE DIAGNOSES:  1. Spinal stenosis with falls and right leg pain.  2. Possible pelvic fracture.  3. Dementia with delirium from above.  4. Encephalopathy from pain, change in location, dementia, etc.   DISCHARGE MEDICATIONS:  1. Lovenox 40 mg daily for 10 days. 2. Amlodipine 2.5 mg daily. 3. Metoprolol 25 mg b.i.d.  4. Aspirin 81 mg daily.  5. Norco 5/325 1 to 2 q.4 hours p.r.n. pain, #30 prescribed.  6. Protonix 40 mg daily.  7. Synthroid 0.05 mg daily with a new diagnosis of hypothyroid with TSH of 14 which is mild to moderately elevated.   HISTORY AND PHYSICAL: Please see detailed history and physical done on admission.   HOSPITAL COURSE: Briefly the patient is an 81 year old male having more and more frequent falls at home, could not be cared for at home, was unsafe presently so was admitted to the hospital. Work-up was basically unrevealing except for possible pelvic fracture on hip MRI, motion artifact was there. CT of the area was negative other than osteoarthritis. He has no spinal stenosis. He does have weakness in his right leg. This likely explains things. Given his extreme age, dementia, etc., further more anatomical type therapy would not be feasible. Again, general evaluation did find a TSH of 14.2. Free thyroxine was low normal at 0.79. Therefore, low dose of Levothyroxine was started. Echocardiogram showed borderline decreased ejection fraction of 50 to 55%. He's really had no signs or symptoms of congestive heart failure. Troponins were normal on admission. Renal function was minimally decreased with GFR of 58 likely secondary to his age. He will need to have further PT/OT and see if his strength and ambulation can be increased. Notably he did not respond to a prednisone taper for spinal stenosis outpatient. [facility] stenosis might be considered if he continues to have trouble as they have evaluated this as outpatient as well.   TIME SPENT: It took approximately 34 minutes to do all discharge tasks today.  ____________________________ Marya AmslerMarshall W. Dareen PianoAnderson, MD [id number] D: 06/06/2011 07:41:44 ET T: 06/06/2011 08:10:06 ET JOB#: [id number]  cc: Marya AmslerMarshall W. Dareen PianoAnderson, MD, <Dictator> Lauro RegulusMARSHALL W Addilyne Backs MD ELECTRONICALLY SIGNED 06/06/2011 17:01
